# Patient Record
Sex: Female | Born: 1975 | Race: Black or African American | Hispanic: No | Marital: Single | Smoking: Never smoker
Health system: Southern US, Community
[De-identification: ages and names within clinical notes are randomized; demographics above are authoritative.]

## PROBLEM LIST (undated history)

## (undated) DIAGNOSIS — Z8619 Personal history of other infectious and parasitic diseases: Secondary | ICD-10-CM

## (undated) DIAGNOSIS — R51 Headache: Secondary | ICD-10-CM

## (undated) DIAGNOSIS — D649 Anemia, unspecified: Secondary | ICD-10-CM

## (undated) DIAGNOSIS — R011 Cardiac murmur, unspecified: Secondary | ICD-10-CM

## (undated) DIAGNOSIS — J45909 Unspecified asthma, uncomplicated: Secondary | ICD-10-CM

## (undated) DIAGNOSIS — I10 Essential (primary) hypertension: Secondary | ICD-10-CM

## (undated) DIAGNOSIS — D259 Leiomyoma of uterus, unspecified: Secondary | ICD-10-CM

## (undated) DIAGNOSIS — N920 Excessive and frequent menstruation with regular cycle: Secondary | ICD-10-CM

## (undated) HISTORY — DX: Essential (primary) hypertension: I10

## (undated) HISTORY — PX: WISDOM TOOTH EXTRACTION: SHX21

## (undated) HISTORY — DX: Anemia, unspecified: D64.9

## (undated) HISTORY — DX: Leiomyoma of uterus, unspecified: D25.9

## (undated) HISTORY — DX: Personal history of other infectious and parasitic diseases: Z86.19

## (undated) HISTORY — DX: Unspecified asthma, uncomplicated: J45.909

## (undated) HISTORY — DX: Excessive and frequent menstruation with regular cycle: N92.0

## (undated) HISTORY — DX: Headache: R51

## (undated) HISTORY — DX: Cardiac murmur, unspecified: R01.1

## (undated) HISTORY — PX: TOOTH EXTRACTION: SHX859

---
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
## 1995-06-22 DIAGNOSIS — N6019 Diffuse cystic mastopathy of unspecified breast: Secondary | ICD-10-CM | POA: Insufficient documentation

## 1995-06-22 HISTORY — PX: MYOMECTOMY: SHX85

## 1998-01-13 ENCOUNTER — Emergency Department (HOSPITAL_COMMUNITY): Admission: EM | Admit: 1998-01-13 | Discharge: 1998-01-13 | Payer: Self-pay | Admitting: Emergency Medicine

## 1999-02-02 ENCOUNTER — Other Ambulatory Visit: Admission: RE | Admit: 1999-02-02 | Discharge: 1999-02-02 | Payer: Self-pay | Admitting: Family Medicine

## 2003-03-21 ENCOUNTER — Emergency Department (HOSPITAL_COMMUNITY): Admission: AD | Admit: 2003-03-21 | Discharge: 2003-03-21 | Payer: Self-pay | Admitting: *Deleted

## 2004-05-07 ENCOUNTER — Emergency Department: Payer: Self-pay | Admitting: General Practice

## 2006-09-06 ENCOUNTER — Ambulatory Visit: Payer: Self-pay | Admitting: Internal Medicine

## 2006-09-07 DIAGNOSIS — I10 Essential (primary) hypertension: Secondary | ICD-10-CM | POA: Insufficient documentation

## 2006-09-07 DIAGNOSIS — Z8679 Personal history of other diseases of the circulatory system: Secondary | ICD-10-CM | POA: Insufficient documentation

## 2006-09-22 ENCOUNTER — Ambulatory Visit: Payer: Self-pay | Admitting: *Deleted

## 2006-10-04 ENCOUNTER — Encounter: Payer: Self-pay | Admitting: Internal Medicine

## 2006-10-04 ENCOUNTER — Ambulatory Visit: Payer: Self-pay | Admitting: Internal Medicine

## 2006-10-26 ENCOUNTER — Ambulatory Visit: Payer: Self-pay | Admitting: Internal Medicine

## 2006-11-08 ENCOUNTER — Encounter: Payer: Self-pay | Admitting: Internal Medicine

## 2006-11-08 ENCOUNTER — Ambulatory Visit (HOSPITAL_COMMUNITY): Admission: RE | Admit: 2006-11-08 | Discharge: 2006-11-08 | Payer: Self-pay | Admitting: Internal Medicine

## 2006-11-19 ENCOUNTER — Emergency Department: Payer: Self-pay | Admitting: Emergency Medicine

## 2006-12-12 ENCOUNTER — Ambulatory Visit: Payer: Self-pay | Admitting: Internal Medicine

## 2007-02-13 ENCOUNTER — Telehealth (INDEPENDENT_AMBULATORY_CARE_PROVIDER_SITE_OTHER): Payer: Self-pay | Admitting: Nurse Practitioner

## 2007-02-15 ENCOUNTER — Ambulatory Visit: Payer: Self-pay | Admitting: Family Medicine

## 2007-02-17 ENCOUNTER — Ambulatory Visit (HOSPITAL_COMMUNITY): Admission: RE | Admit: 2007-02-17 | Discharge: 2007-02-17 | Payer: Self-pay | Admitting: Internal Medicine

## 2007-03-02 ENCOUNTER — Ambulatory Visit: Payer: Self-pay | Admitting: Internal Medicine

## 2007-03-02 DIAGNOSIS — D509 Iron deficiency anemia, unspecified: Secondary | ICD-10-CM

## 2007-03-14 ENCOUNTER — Telehealth (INDEPENDENT_AMBULATORY_CARE_PROVIDER_SITE_OTHER): Payer: Self-pay | Admitting: Nurse Practitioner

## 2007-06-01 ENCOUNTER — Ambulatory Visit: Payer: Self-pay | Admitting: Obstetrics and Gynecology

## 2007-07-20 ENCOUNTER — Ambulatory Visit: Payer: Self-pay | Admitting: Obstetrics and Gynecology

## 2007-08-04 ENCOUNTER — Ambulatory Visit: Payer: Self-pay | Admitting: Nurse Practitioner

## 2007-08-04 DIAGNOSIS — K029 Dental caries, unspecified: Secondary | ICD-10-CM | POA: Insufficient documentation

## 2007-08-04 DIAGNOSIS — E669 Obesity, unspecified: Secondary | ICD-10-CM | POA: Insufficient documentation

## 2007-08-04 LAB — CONVERTED CEMR LAB
Eosinophils Relative: 2 % (ref 0–5)
HCT: 37 % (ref 36.0–46.0)
Hemoglobin: 11.7 g/dL — ABNORMAL LOW (ref 12.0–15.0)
Hgb A1c MFr Bld: 5.8 %
Lymphocytes Relative: 35 % (ref 12–46)
Lymphs Abs: 2.1 10*3/uL (ref 0.7–4.0)
Monocytes Absolute: 0.4 10*3/uL (ref 0.1–1.0)
RBC: 4.46 M/uL (ref 3.87–5.11)
WBC: 6 10*3/uL (ref 4.0–10.5)

## 2007-08-07 ENCOUNTER — Encounter (INDEPENDENT_AMBULATORY_CARE_PROVIDER_SITE_OTHER): Payer: Self-pay | Admitting: Nurse Practitioner

## 2007-09-14 ENCOUNTER — Ambulatory Visit: Payer: Self-pay | Admitting: Nurse Practitioner

## 2007-09-14 LAB — CONVERTED CEMR LAB
CO2: 29 meq/L (ref 19–32)
Calcium: 8.8 mg/dL (ref 8.4–10.5)
Chloride: 104 meq/L (ref 96–112)
Cholesterol: 117 mg/dL (ref 0–200)
Creatinine, Ser: 0.81 mg/dL (ref 0.40–1.20)
Glucose, Bld: 64 mg/dL — ABNORMAL LOW (ref 70–99)
Total Bilirubin: 0.3 mg/dL (ref 0.3–1.2)
Total CHOL/HDL Ratio: 2.3
Total Protein: 6.6 g/dL (ref 6.0–8.3)
Triglycerides: 51 mg/dL (ref <150)
VLDL: 10 mg/dL (ref 0–40)

## 2007-09-21 ENCOUNTER — Ambulatory Visit: Payer: Self-pay | Admitting: Obstetrics and Gynecology

## 2007-10-16 ENCOUNTER — Ambulatory Visit: Payer: Self-pay | Admitting: Nurse Practitioner

## 2007-10-16 DIAGNOSIS — M79609 Pain in unspecified limb: Secondary | ICD-10-CM

## 2007-10-16 LAB — CONVERTED CEMR LAB
ALT: 18 units/L (ref 0–35)
AST: 17 units/L (ref 0–37)
Basophils Absolute: 0 10*3/uL (ref 0.0–0.1)
Basophils Relative: 0 % (ref 0–1)
Bilirubin Urine: NEGATIVE
Blood in Urine, dipstick: NEGATIVE
CO2: 25 meq/L (ref 19–32)
Calcium: 9.1 mg/dL (ref 8.4–10.5)
Chlamydia, DNA Probe: NEGATIVE
Chloride: 102 meq/L (ref 96–112)
Cholesterol: 127 mg/dL (ref 0–200)
GC Probe Amp, Genital: NEGATIVE
Hemoglobin: 11.6 g/dL — ABNORMAL LOW (ref 12.0–15.0)
KOH Prep: NEGATIVE
Ketones, urine, test strip: NEGATIVE
Lymphocytes Relative: 31 % (ref 12–46)
MCHC: 30.8 g/dL (ref 30.0–36.0)
Neutro Abs: 3.7 10*3/uL (ref 1.7–7.7)
Neutrophils Relative %: 59 % (ref 43–77)
Nitrite: NEGATIVE
Platelets: 246 10*3/uL (ref 150–400)
Potassium: 4.2 meq/L (ref 3.5–5.3)
RDW: 15.3 % (ref 11.5–15.5)
Sodium: 139 meq/L (ref 135–145)
Specific Gravity, Urine: 1.015
TSH: 1.424 microintl units/mL (ref 0.350–5.50)
Total CHOL/HDL Ratio: 2.4
Total Protein: 7 g/dL (ref 6.0–8.3)

## 2007-10-17 LAB — CONVERTED CEMR LAB: Pap Smear: NEGATIVE

## 2007-10-23 ENCOUNTER — Encounter (INDEPENDENT_AMBULATORY_CARE_PROVIDER_SITE_OTHER): Payer: Self-pay | Admitting: Nurse Practitioner

## 2008-02-28 ENCOUNTER — Other Ambulatory Visit: Payer: Self-pay | Admitting: Obstetrics and Gynecology

## 2008-02-28 ENCOUNTER — Telehealth (INDEPENDENT_AMBULATORY_CARE_PROVIDER_SITE_OTHER): Payer: Self-pay | Admitting: Nurse Practitioner

## 2008-02-28 ENCOUNTER — Emergency Department (HOSPITAL_COMMUNITY): Admission: EM | Admit: 2008-02-28 | Discharge: 2008-02-28 | Payer: Self-pay | Admitting: Emergency Medicine

## 2008-03-04 ENCOUNTER — Ambulatory Visit: Payer: Self-pay | Admitting: Nurse Practitioner

## 2008-03-04 DIAGNOSIS — R498 Other voice and resonance disorders: Secondary | ICD-10-CM | POA: Insufficient documentation

## 2008-03-22 ENCOUNTER — Ambulatory Visit: Payer: Self-pay | Admitting: Nurse Practitioner

## 2008-04-05 ENCOUNTER — Ambulatory Visit: Payer: Self-pay | Admitting: Obstetrics & Gynecology

## 2008-04-05 ENCOUNTER — Other Ambulatory Visit: Admission: RE | Admit: 2008-04-05 | Discharge: 2008-04-05 | Payer: Self-pay | Admitting: Obstetrics and Gynecology

## 2008-04-05 ENCOUNTER — Encounter (INDEPENDENT_AMBULATORY_CARE_PROVIDER_SITE_OTHER): Payer: Self-pay | Admitting: Nurse Practitioner

## 2008-04-05 DIAGNOSIS — Z9889 Other specified postprocedural states: Secondary | ICD-10-CM | POA: Insufficient documentation

## 2008-04-10 ENCOUNTER — Encounter (INDEPENDENT_AMBULATORY_CARE_PROVIDER_SITE_OTHER): Payer: Self-pay | Admitting: Nurse Practitioner

## 2008-04-19 ENCOUNTER — Ambulatory Visit: Payer: Self-pay | Admitting: Obstetrics & Gynecology

## 2008-05-13 ENCOUNTER — Ambulatory Visit (HOSPITAL_COMMUNITY): Admission: RE | Admit: 2008-05-13 | Discharge: 2008-05-13 | Payer: Self-pay | Admitting: Obstetrics & Gynecology

## 2008-07-22 IMAGING — US US TRANSVAGINAL NON-OB
1 series · 14 of 25 positions shown · non-contrast
Comparison: None

CLINICAL DATA: Pelvic pain

TRANSABDOMINAL AND TRANSVAGINAL PELVIC ULTRASOUND
TECHNIQUE: Both transabdominal and transvaginal ultrasound examinations of the
pelvis were performed including evaluation of the uterus, ovaries, adnexal
regions, and pelvic cul-de-sac.

[Series 1: unknown · 0.37mm/px · 14 of 48 slices shown]
[im 1/48]
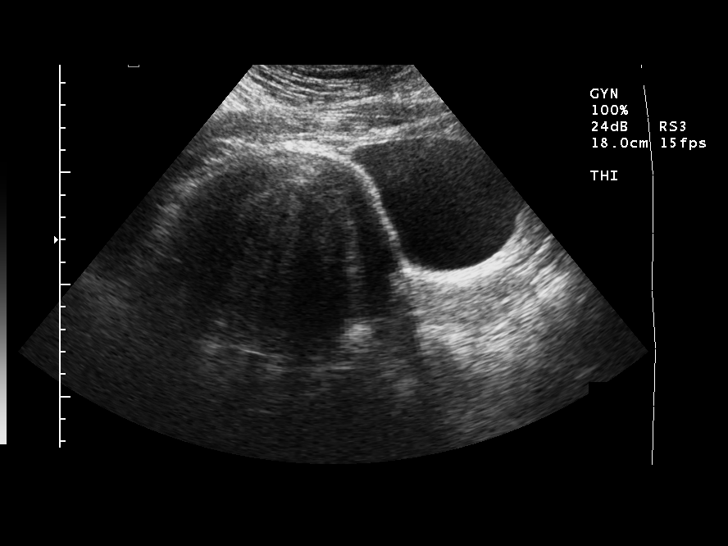
[im 4/48]
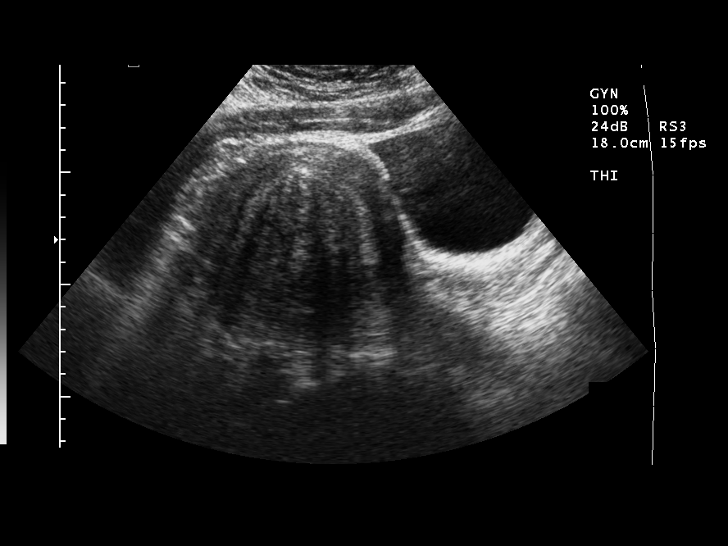
[im 8/48]
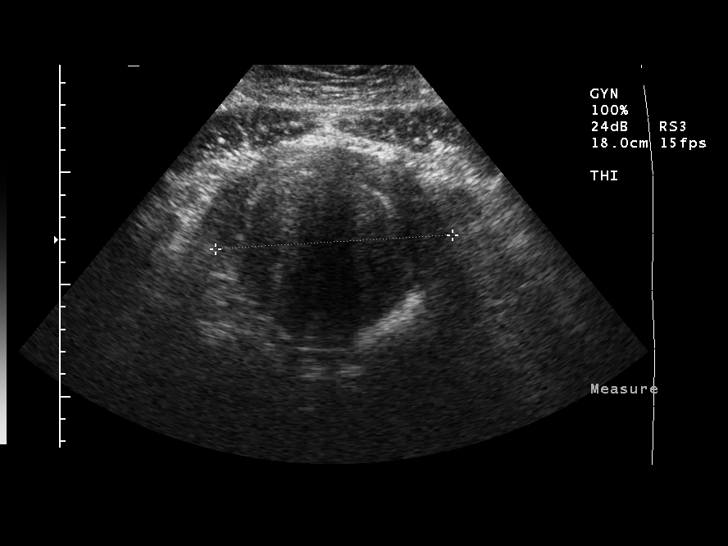
[im 12/48]
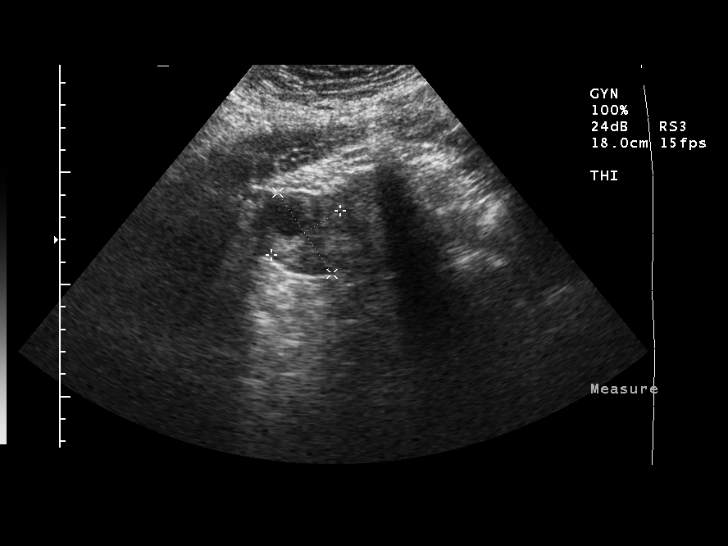
[im 16/48]
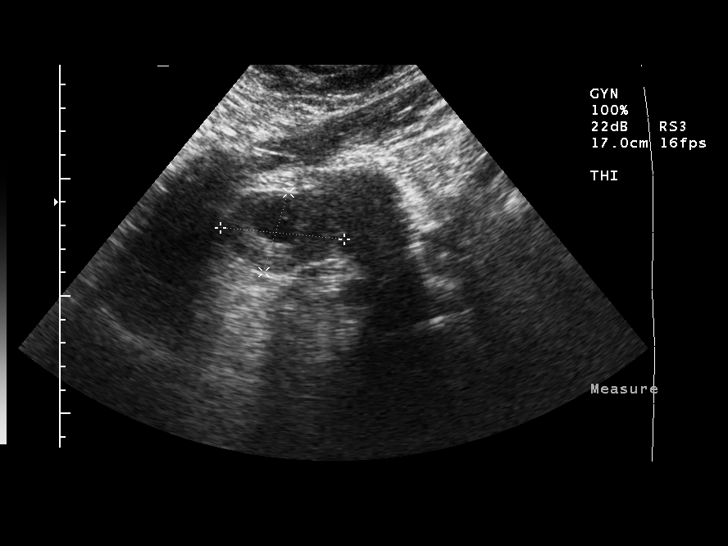
[im 18/48]
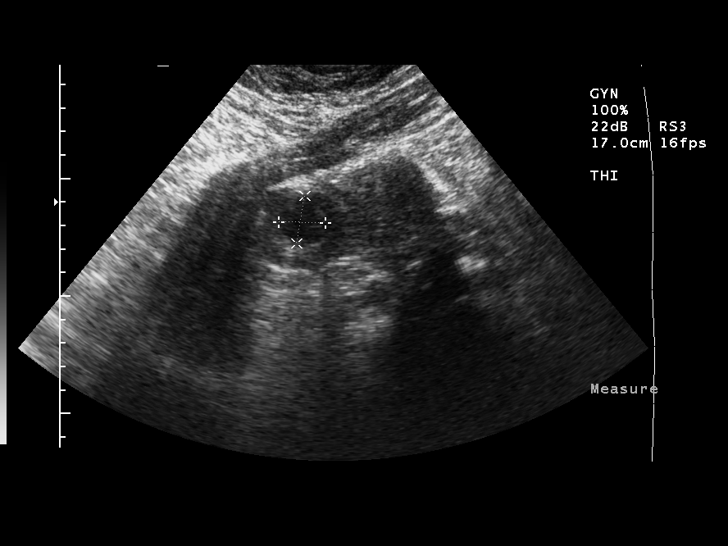
[im 22/48]
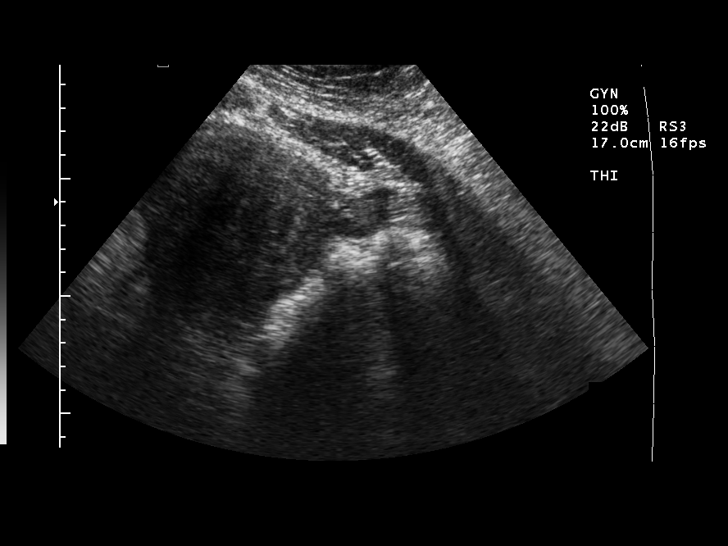
[im 26/48]
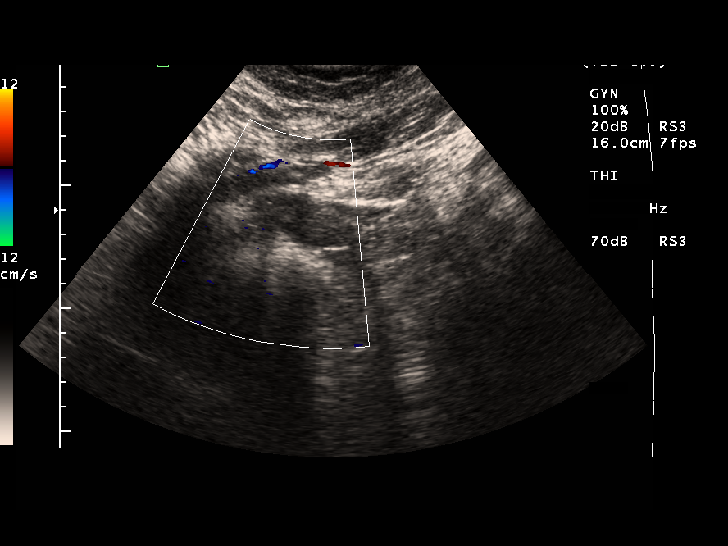
[im 30/48]
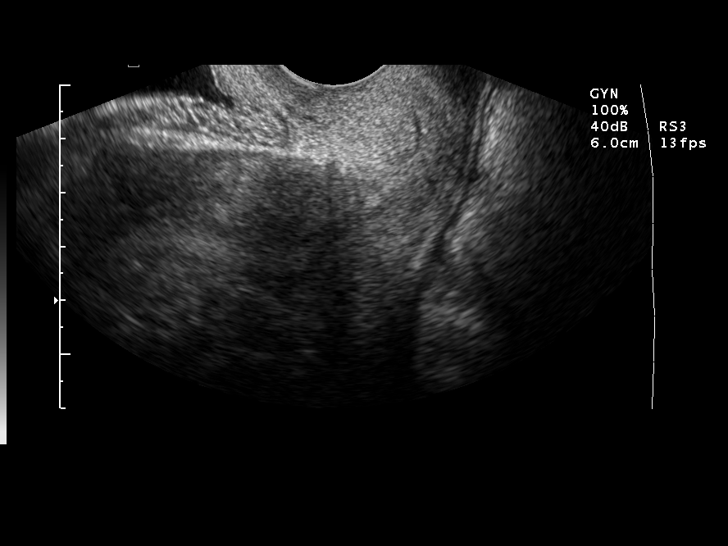
[im 32/48]
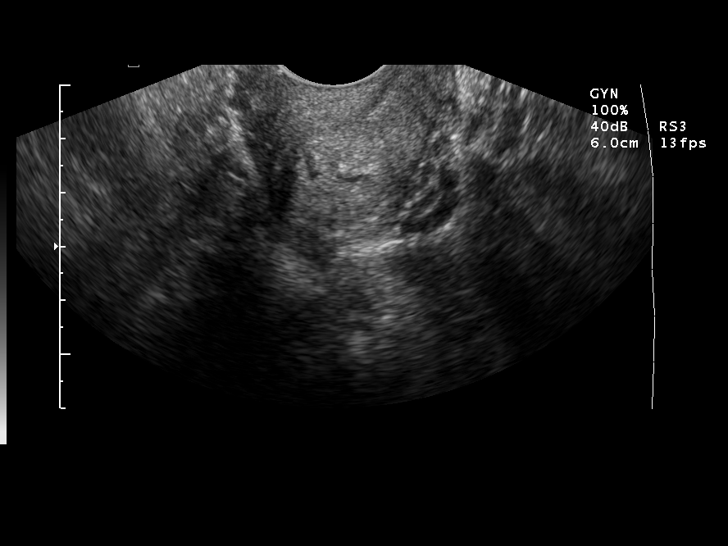
[im 36/48]
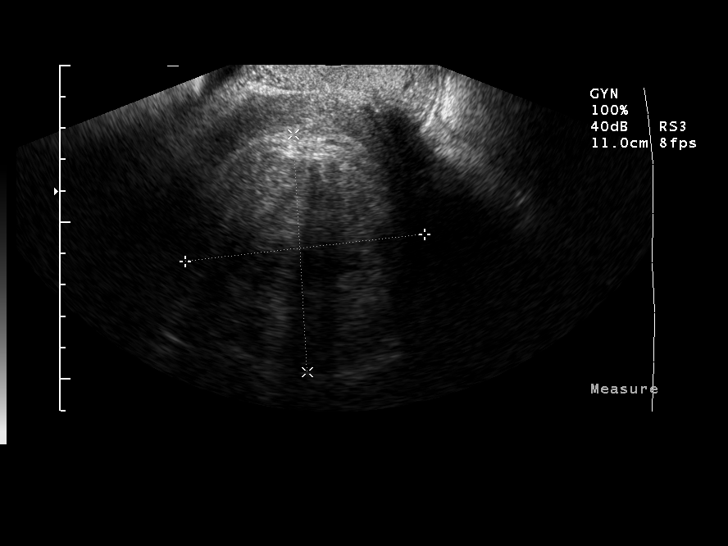
[im 40/48]
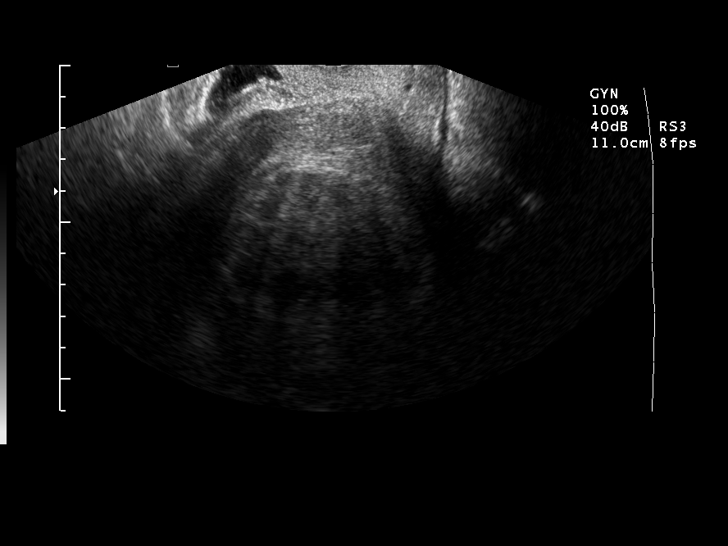
[im 44/48]
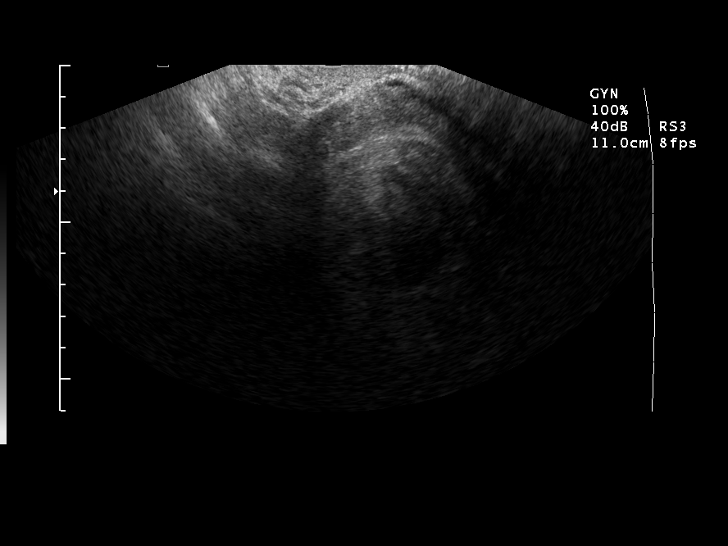
[im 48/48]
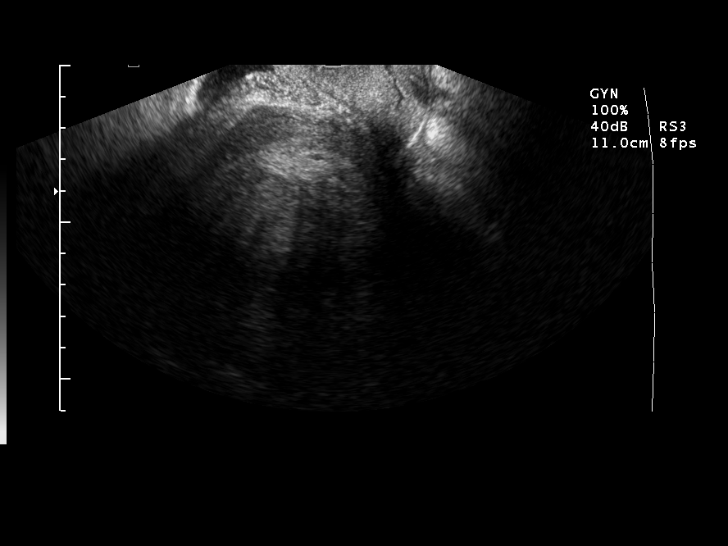

[14 of 25 positions shown; findings below may reference images not displayed]

FINDINGS: Uterus measures 14.4 x 8.7 x 10.6 cm. Large central fibroid noted
measuring up to 7.3 cm. Endometrium is obscured.

2.1 cm minimally complex cysts noted in the right ovary. Left ovary
unremarkable. No free fluid.

IMPRESSION

Uterus is enlarged with a large 7.3 cm central fibroid, which obscures the
endometrium. 

Minimally complex 2.1 cm right ovarian cyst. This could be followed with repeat
ultrasound in 1-2 cycles.

## 2008-07-30 ENCOUNTER — Ambulatory Visit: Payer: Self-pay | Admitting: Obstetrics & Gynecology

## 2008-08-07 ENCOUNTER — Telehealth (INDEPENDENT_AMBULATORY_CARE_PROVIDER_SITE_OTHER): Payer: Self-pay | Admitting: Nurse Practitioner

## 2008-08-20 ENCOUNTER — Encounter (INDEPENDENT_AMBULATORY_CARE_PROVIDER_SITE_OTHER): Payer: Self-pay | Admitting: *Deleted

## 2008-09-06 ENCOUNTER — Telehealth (INDEPENDENT_AMBULATORY_CARE_PROVIDER_SITE_OTHER): Payer: Self-pay | Admitting: Nurse Practitioner

## 2010-01-15 ENCOUNTER — Ambulatory Visit: Payer: Self-pay | Admitting: Obstetrics and Gynecology

## 2010-01-23 ENCOUNTER — Ambulatory Visit (HOSPITAL_COMMUNITY): Admission: RE | Admit: 2010-01-23 | Discharge: 2010-01-23 | Payer: Self-pay | Admitting: Obstetrics & Gynecology

## 2010-01-28 ENCOUNTER — Ambulatory Visit: Payer: Self-pay | Admitting: Obstetrics & Gynecology

## 2010-04-26 ENCOUNTER — Emergency Department (HOSPITAL_COMMUNITY): Admission: EM | Admit: 2010-04-26 | Discharge: 2010-04-27 | Payer: Self-pay | Admitting: Emergency Medicine

## 2010-05-12 ENCOUNTER — Emergency Department (HOSPITAL_COMMUNITY): Admission: EM | Admit: 2010-05-12 | Discharge: 2010-05-12 | Payer: Self-pay | Admitting: Emergency Medicine

## 2010-05-21 ENCOUNTER — Ambulatory Visit: Payer: Self-pay | Admitting: Obstetrics and Gynecology

## 2010-05-21 ENCOUNTER — Encounter: Payer: Self-pay | Admitting: Obstetrics and Gynecology

## 2010-05-21 LAB — CONVERTED CEMR LAB
Hemoglobin: 9.2 g/dL — ABNORMAL LOW (ref 12.0–15.0)
MCHC: 29.4 g/dL — ABNORMAL LOW (ref 30.0–36.0)
RBC: 4.42 M/uL (ref 3.87–5.11)
RDW: 19.9 % — ABNORMAL HIGH (ref 11.5–15.5)

## 2010-06-04 ENCOUNTER — Ambulatory Visit: Payer: Self-pay | Admitting: Obstetrics & Gynecology

## 2010-07-12 ENCOUNTER — Encounter: Payer: Self-pay | Admitting: *Deleted

## 2010-09-01 LAB — URINE MICROSCOPIC-ADD ON

## 2010-09-01 LAB — POCT I-STAT, CHEM 8
BUN: 8 mg/dL (ref 6–23)
Calcium, Ion: 1.16 mmol/L (ref 1.12–1.32)
Chloride: 105 mEq/L (ref 96–112)
Glucose, Bld: 95 mg/dL (ref 70–99)
TCO2: 26 mmol/L (ref 0–100)

## 2010-09-01 LAB — URINALYSIS, ROUTINE W REFLEX MICROSCOPIC
Glucose, UA: NEGATIVE mg/dL
Ketones, ur: NEGATIVE mg/dL
Leukocytes, UA: NEGATIVE
Nitrite: NEGATIVE
Protein, ur: NEGATIVE mg/dL
pH: 7 (ref 5.0–8.0)

## 2010-11-03 NOTE — Group Therapy Note (Signed)
Cassie Mills, Cassie Mills NO.:  [id number]   MEDICAL RECORD NO.:  [id number]          PATIENT TYPE:  WOC   LOCATION:  [facility]                   FACILITY:  [facility]   PHYSICIAN:  Argentina Donovan, MD        DATE OF BIRTH:  [birth year]   DATE OF SERVICE:  09/21/2007                                  CLINIC NOTE   The patient is a 35 year old African-American female who has been  bothered by some dysfunction bleeding.  She was placed on iron therapy  for anemia.  An ultrasound showed the uterus about 14 x 8 x 10 cm with a  7.5 cm central fibroid skirting the endometrium.  She plans on being  married next year.  She had a previous myomectomy done in 1997 by Dr.  Ambrose Mantle, who had a very difficult time with the procedure because of  visualization problems.  The patient's other complaint is that she tends  to have chronic pelvic pain secondary to fibroids, especially during her  period and when she has bleeding.  She was wondering about pregnancy.  I  told her that I am afraid with that fibroid in there that she would  probably end up with preterm baby.  She wanted me to reevaluate that, so  I told her we would do a hysteroscopy and D&C and see what that could  show from that point of view, but I think that she would probably be  better off with a hysterectomy.  She says that she would not object to  hysterectomy if it did not look as if it would be practical to do  anything through the vagina with the hysteroscope in order to control or  change the problem.   IMPRESSION:  Uterine fibroids with menometrorrhagia.   PLAN:  A diagnostic hysteroscopy and D&C, and to be able to better  counsel the patient.  I told her that if she decides on a hysterectomy,  we have to get her to financial counseling to begin with, and she  understands that.           ______________________________  Argentina Donovan, MD     PR/MEDQ  D:  09/21/2007  T:  09/21/2007  Job:  [id number]

## 2010-11-03 NOTE — Group Therapy Note (Signed)
NAMESELMA, MINK NO.:  [id number]   MEDICAL RECORD NO.:  [id number]          PATIENT TYPE:  WOC   LOCATION:  [facility]                   FACILITY:  [facility]   PHYSICIAN:  Allie Bossier, MD        DATE OF BIRTH:  [birth year]   DATE OF SERVICE:  04/05/2008                                  CLINIC NOTE   REASON FOR VISIT:  Endometrial biopsy.   HISTORY OF PRESENT ILLNESS:  Ms. Khachatryan is a 35 year old G0 female with  approximately 1-year history of heavy periods and painful periods.  She  did have an ultrasound back in August 2008, which showed a uterus that  measured 14.4 x 8.7 x 10.6 cm with a large central fibroid noted  measuring up to 7.3 cm.  Since that time, she has continued to have  heavy bleeding; however, that is not her most bothersome symptom and is  the pain she feels with her period.  She does take Aleve, which relieves  her pain quite a bit but not entirely.  She has had a history of a  myomectomy in 1997 by Dr. Ambrose Mantle and she is not currently sexually  active.  Her first date of the last menstrual period was 03/24/2008.  Last Pap smear was in May 2009 and was normal.  She has not had any  mammograms as of yet.   PAST MEDICAL HISTORY:  1. Hypertension.  2. Anemia.   MEDICATIONS:  1. Iron 325 mg daily.  2. Avalide 5 mg daily.   ALLERGIES:  She has not allergic to any medicines.   PAST SURGICAL HISTORY:  As stated above, she had a myomectomy in 1997 by  Dr. Ambrose Mantle.   OB HISTORY:  She is G0.   GYN HISTORY:  No history of abnormal Pap smear.  Urine pregnancy test  today is negative.   PHYSICAL EXAMINATION:  VITAL SIGNS:  Temperature today is 98.9, pulse  70, blood pressure 132/82.  Weight is 256.6 pounds and height is 5 feet  4 inches tall.  GENERAL:  She is alert, well-developed, well-nourished, obese female, in  no acute distress, and cooperative to exam.  GU:  Bimanual was performed showing an approximately 16-18 week size  uterus.  She  has normal external Tanner V genitalia without vulvar  lesion.  Vagina was pink and moist with normal rugae.  Cervix was well  visualized in the midline.  No obvious lesion or friability.  An  endometrial biopsy was obtained.  The cervix was coated with iodine  prior to the procedure.  The uterus was sounded to approximately 16 cm,  the Pipelle was inserted and one full rotation was obtained with a  return of the large tissue sample, which was placed in formalin and sent  for pathology.  The patient tolerated the procedure well.   ASSESSMENT AND PLAN:  This is a 35 year old G0 female with dysmenorrhea  and menorrhagia as well as a history of fibroids who presents today for  endometrial biopsy.  1. Biopsy was obtained as described above.  The patient will return in  2 weeks for results.  She will also be scheduled for a transvaginal      ultrasound to further characterize the anatomy of her uterus and      endometrium.     ______________________________  Ardeen Garland, MD    ______________________________  Allie Bossier, MD    LM/MEDQ  D:  04/05/2008  T:  04/06/2008  Job:  [id number]

## 2010-11-03 NOTE — Group Therapy Note (Signed)
Cassie Mills, SCHAFFERT NO.:  [id number]   MEDICAL RECORD NO.:  [id number]          PATIENT TYPE:  WOC   LOCATION:  [facility]                   FACILITY:  [facility]   PHYSICIAN:  Argentina Donovan, MD        DATE OF BIRTH:  08-03-1975   DATE OF SERVICE:  06/01/2007                                  CLINIC NOTE   The patient is a 35 year old African American female who was sent in  from [facility] because of dysfunctional uterine bleeding.  She had  been placed on iron therapy because of anemia secondary to the problem  and they sent her for an ultrasound and ultrasound showed a uterus that  was enlarged 14 x 8 x 10 cm with a large 7.5 cm central fibroid that  obscured the endometrium.  She plans on getting married next June and  was interested in:  1. Controlling her bleeding.  2. Talking about pregnancy.   Apparently, she had a myomectomy done by Dr. Ambrose Mantle and 1997 and the  records are not available at the present. They are on micro film so we  are going to request those and her come back in a couple weeks to be  able to more intelligently discuss whether myomectomy would be practical  or whether hysterectomy should be what should be considered.   The patient weighs 256 pounds. She is 5 feet 4 inches tall and today she  has a slight blood pressure elevation of 140/98.   She is on lisinopril, iron and prenatal vitamins daily.   She has never had a pregnancy and never used any contraception.   IMPRESSION:  Is symptomatic fibroids with dysfunctional bleeding and  secondary anemia. The patient will be given an appointment to come back  in 2 weeks to discuss the condition further.           ______________________________  Argentina Donovan, MD     PR/MEDQ  D:  06/01/2007  T:  06/01/2007  Job:  [id number]

## 2010-11-03 NOTE — Group Therapy Note (Signed)
Cassie Mills, Cassie Mills NO.:  [id number]   MEDICAL RECORD NO.:  [id number]          PATIENT TYPE:  WOC   LOCATION:  [facility]                   FACILITY:  [facility]   PHYSICIAN:  Johnella Moloney, MD        DATE OF BIRTH:  1975-08-17   DATE OF SERVICE:                                  CLINIC NOTE   The patient is a 35 year old, gravida 0, who was seen on April 05, 2008, for 1-year history of menorrhagia and dysmenorrhea and known  pelvic fibroids.  Upon her evaluation, the patient did undergo an  endometrial biopsy, and she is here to follow up results.  The patient  reports continued pain in her pelvis and lower back, but no other  symptoms.   Her endometrial biopsy was remarkable for proliferative endometrium with  stromal hemorrhage.  No endometrial hyperplasia or malignancy  identified.  The patient was reassured by this result.  Of note, the  patient did have an ultrasound in August 2008 which showed a fibroid  uterus measuring 15 cm in the largest dimension and a large central  fibroid measuring 7.3 cm.  She does have a history of a myomectomy in  1997 by Dr. Ambrose Mantle.  The plan was for her to get an ultrasound after her  prior visit, but this was not scheduled, so we will go ahead and  schedule a pelvic ultrasound to see what the change has been in 1 year.  The patient was advised about different options for management including  Depo-Lupron, which could be used to shrink the fibroids.  Myomectomy  and/or hysterectomy, the patient is unsure of what she wants to do for  how.  She is not interested in myomectomy and wants definitive surgery  even though she is a gravida 0.  She says she does not have any future  plans for child bearing.  The patient was given a patient information  handout on how to manage fibroids.  She was also given the application  for Depo-Lupron to fill out given that the patient is self-pay.  The  patient is in the process of applying for the  [facility] sliding fee and  discount and hopes that she will be able to get this in time for her  surgery.  In the meantime, if she does qualify for the Lupron, the  patient is interested in getting this as a way to shrink her fibroid  uterus and also make the operative modality and her symptoms better and  also minimize blood loss.  The patient reports taking Aleve for her  dysmenorrhea which usually helps with her pain.  The patient will follow  up in the [facility] after a pelvic ultrasound for discussion of  pelvic ultrasound results and also to make definitive plans for surgery.            ______________________________  Johnella Moloney, MD     UD/MEDQ  D:  04/19/2008  T:  04/19/2008  Job:  [id number]

## 2011-03-29 LAB — POCT PREGNANCY, URINE: Operator id: 297281

## 2011-09-30 IMAGING — CR DG CHEST 2V
2 series · 2 of 2 positions shown · non-contrast
Comparison: None.

CLINICAL DATA: Chest pain.

CHEST - 2 VIEW

[w chest pa *]
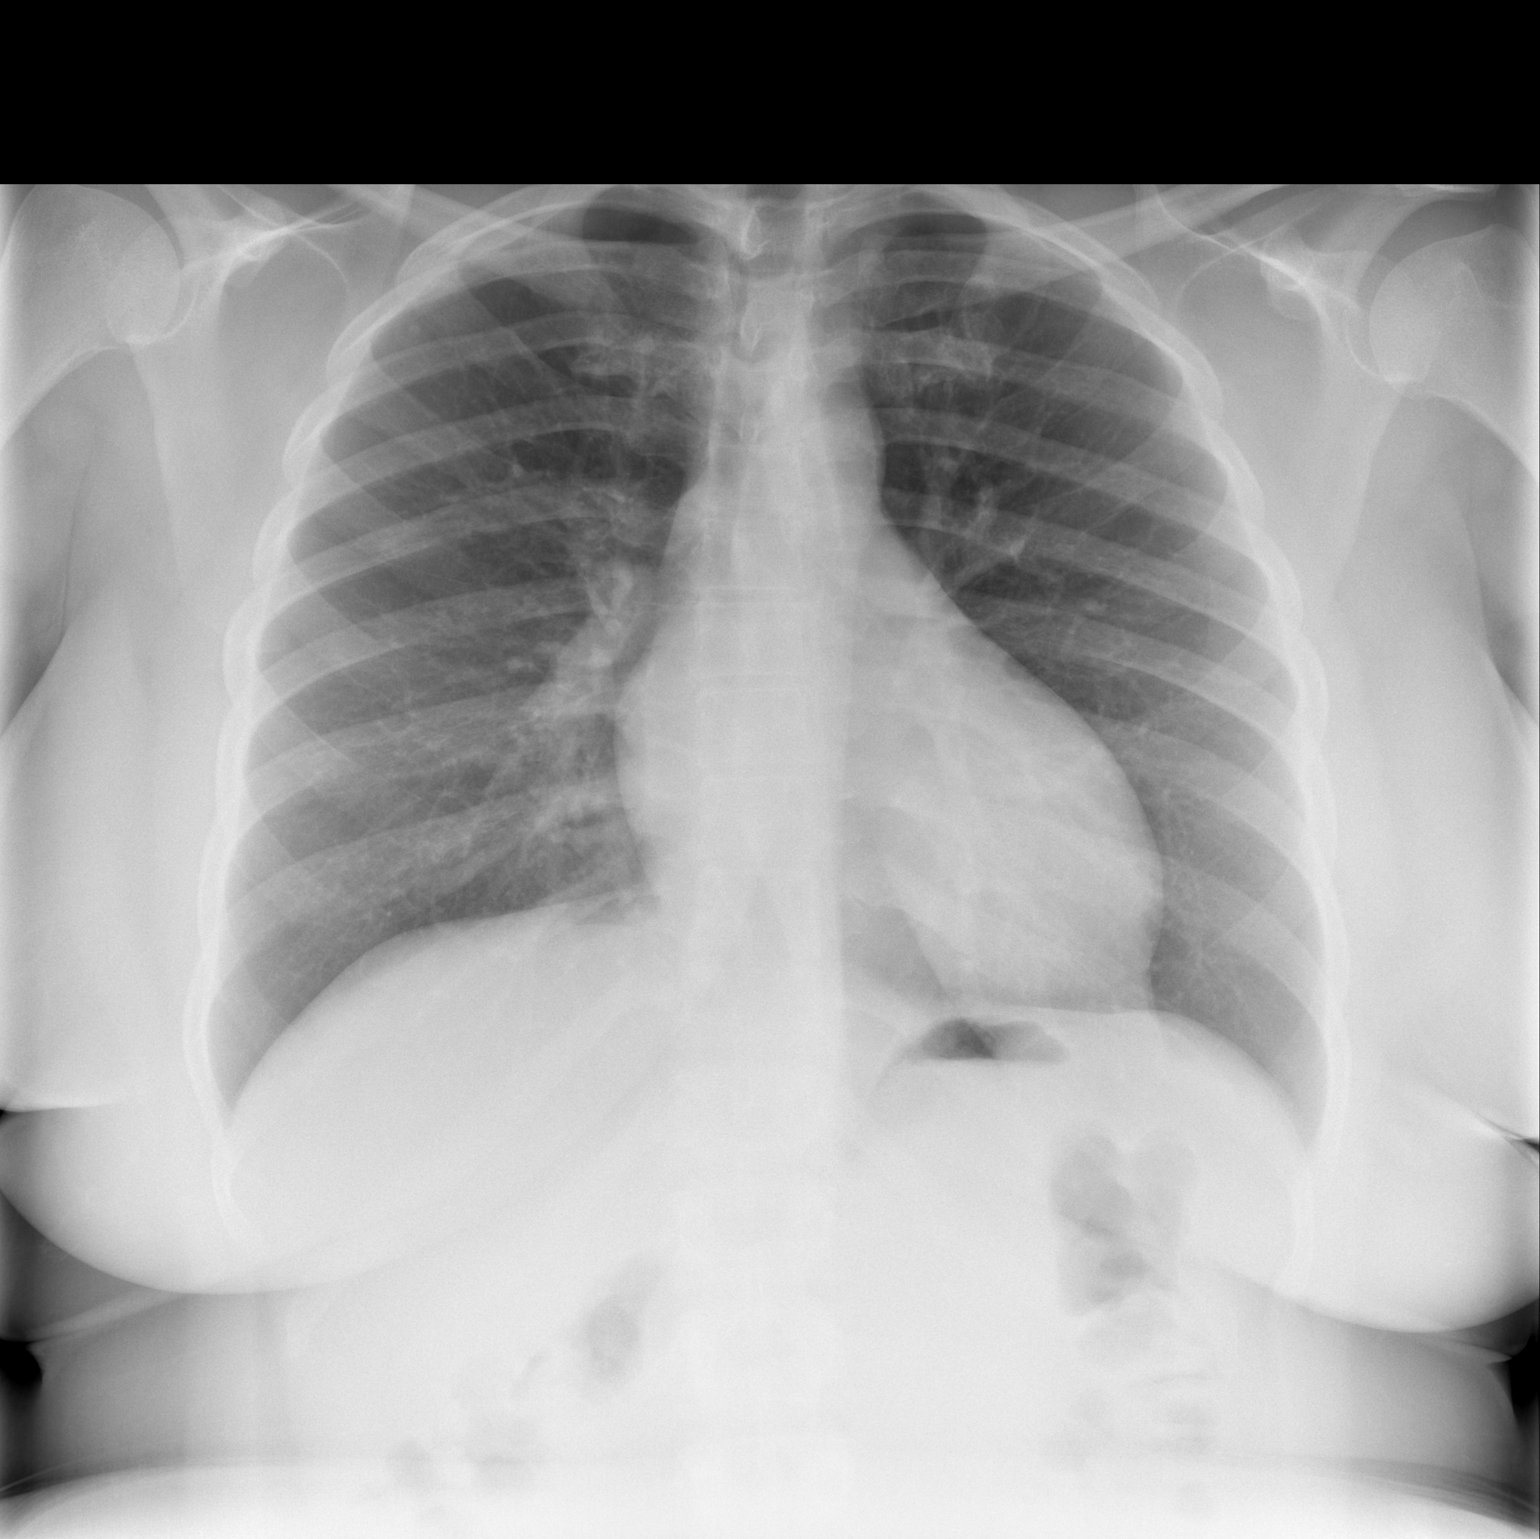

[w chest lat *]
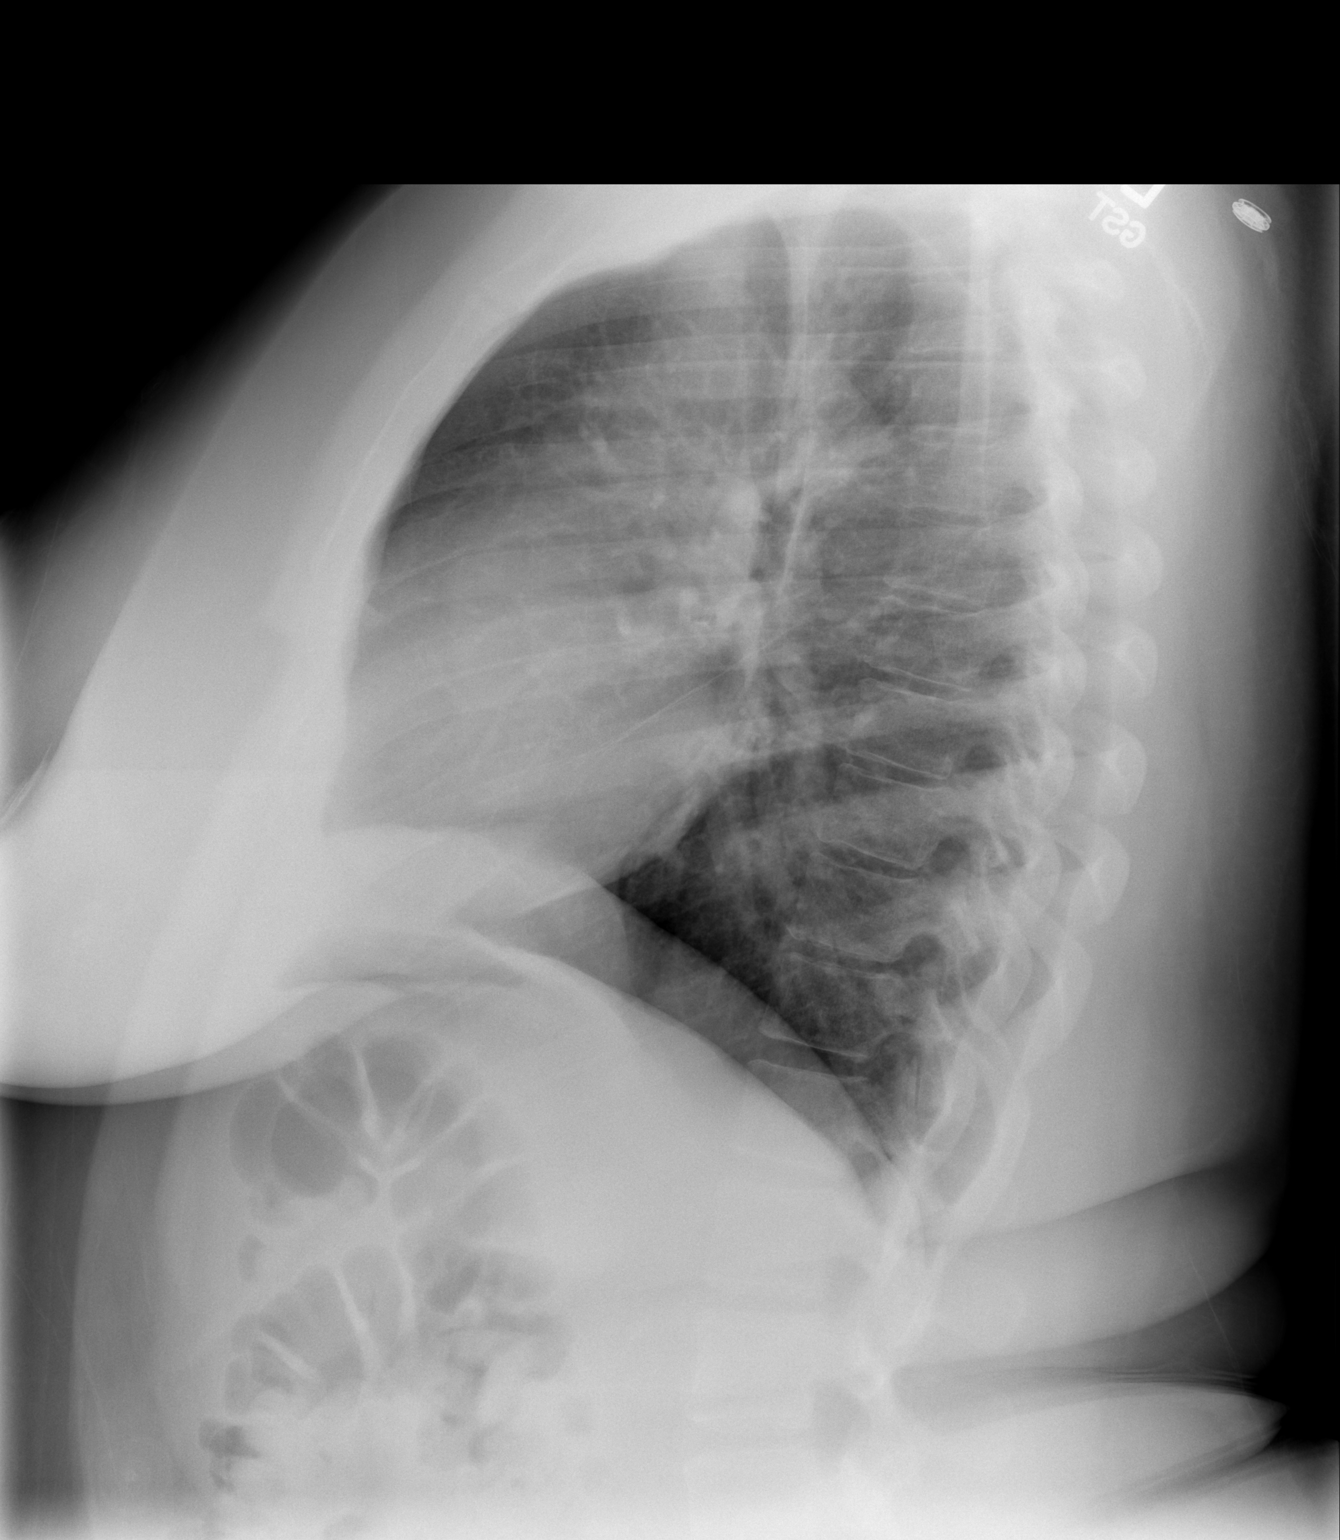

[2 of 2 positions shown; findings below may reference images not displayed]

FINDINGS: Lungs clear.  Cardiopericardial silhouette within normal
limits.  Trachea midline.  No airspace disease or effusion.
IMPRESSION: No active cardiopulmonary disease.

## 2011-10-15 IMAGING — CT CT HEAD W/O CM
1 series · 16 of 30 positions shown, 20 images · non-contrast
Comparison: None

CLINICAL DATA: Headache

CT HEAD WITHOUT CONTRAST
TECHNIQUE: Contiguous axial images were obtained from the base of
the skull through the vertex without contrast.

[Series 2: head routine 4.8 h37s · axial · 0.43mm/px · z∈[-115,+16]mm · 16 of 30 slices shown, 20 images]
[im 2/30  brain]
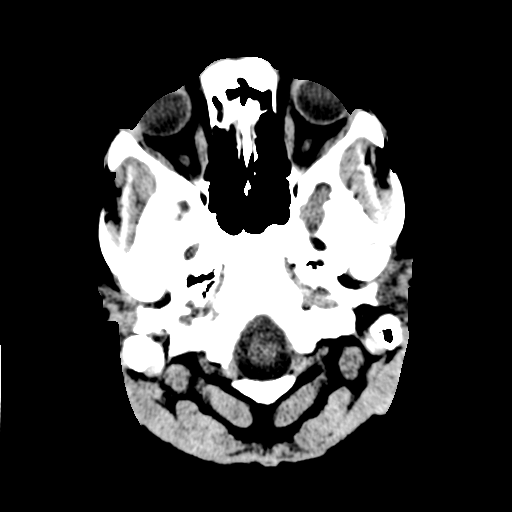
[im 2/30  bone]
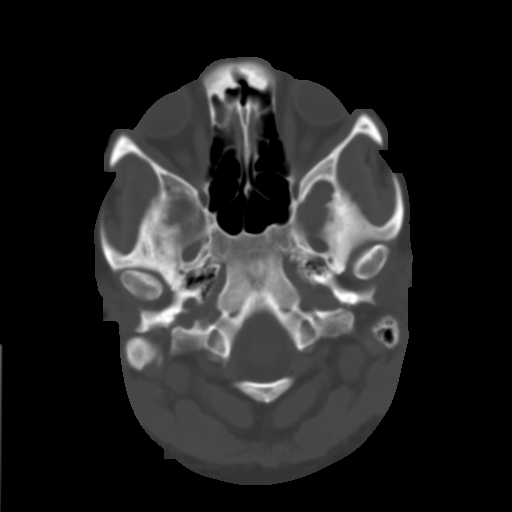
[im 4/30  brain]
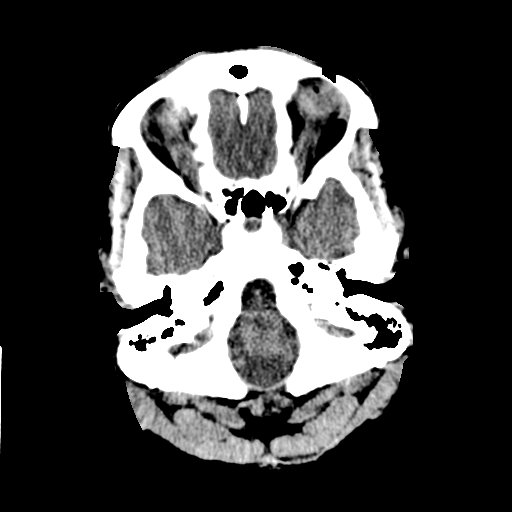
[im 6/30  brain]
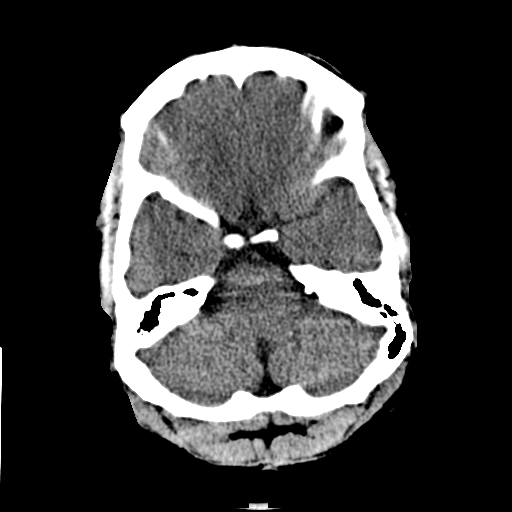
[im 8/30  brain]
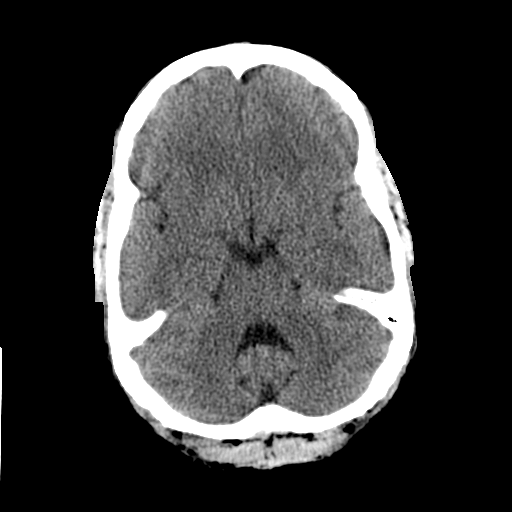
[im 9/30  brain]
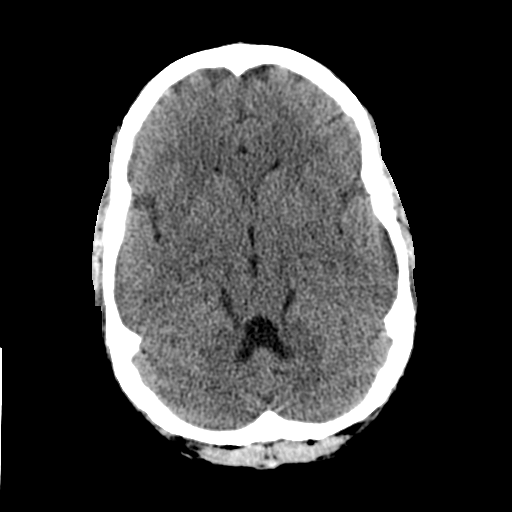
[im 9/30  bone]
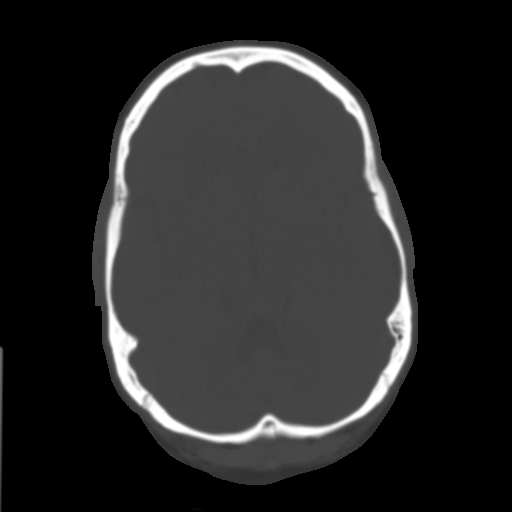
[im 11/30  brain]
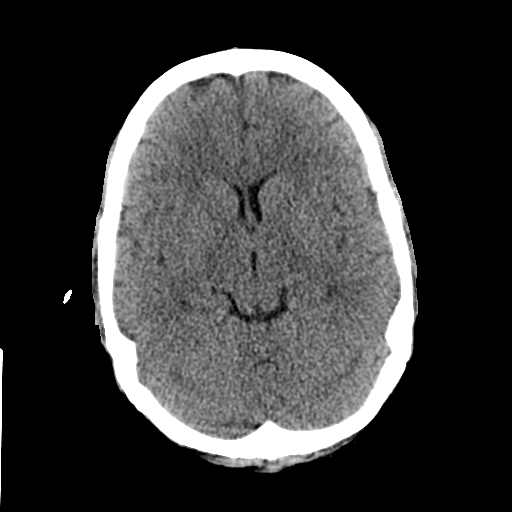
[im 13/30  brain]
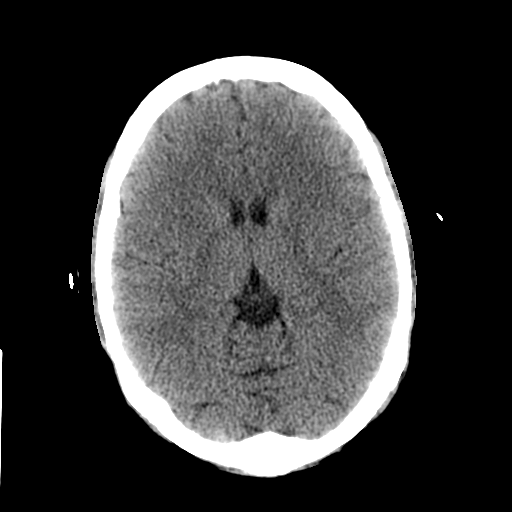
[im 15/30  brain]
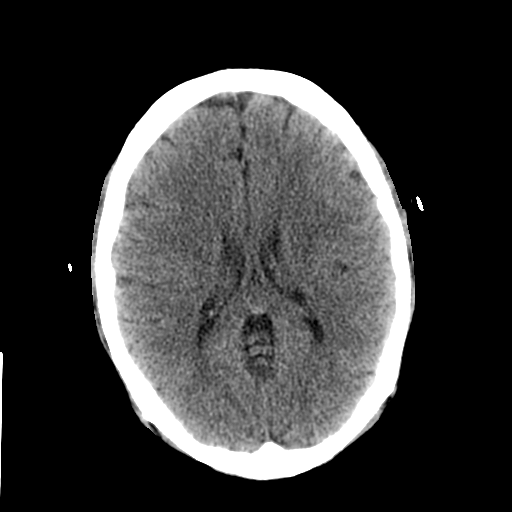
[im 16/30  brain]
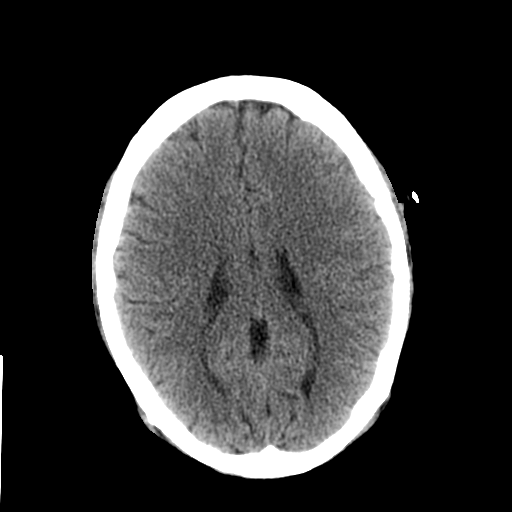
[im 16/30  bone]
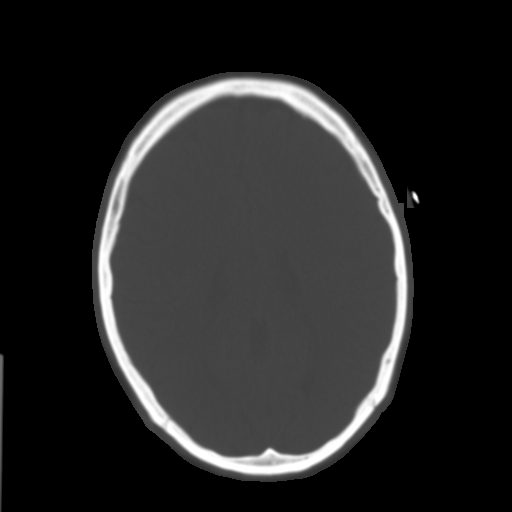
[im 18/30  brain]
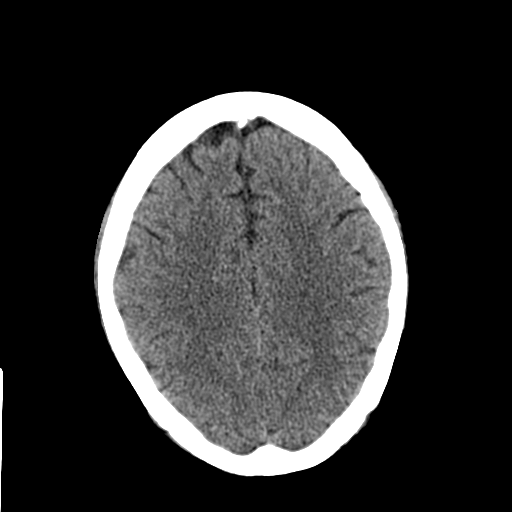
[im 20/30  brain]
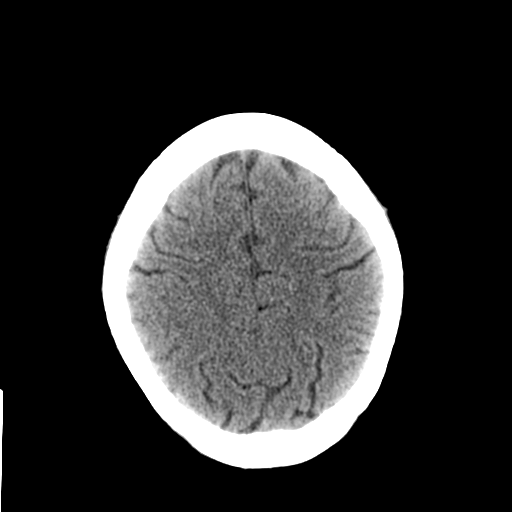
[im 22/30  brain]
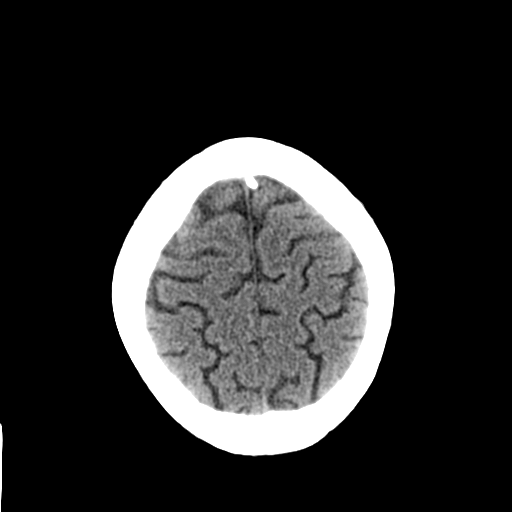
[im 23/30  brain]
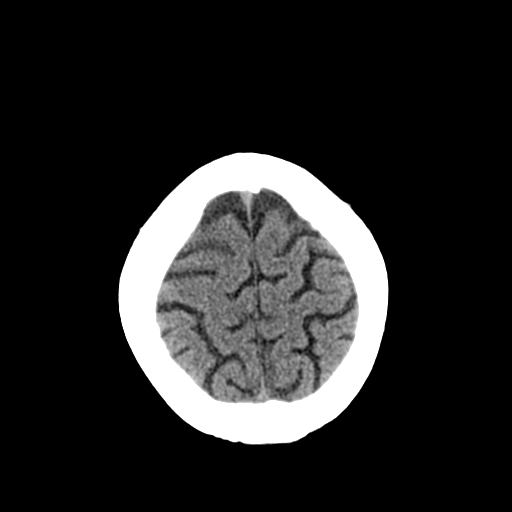
[im 23/30  bone]
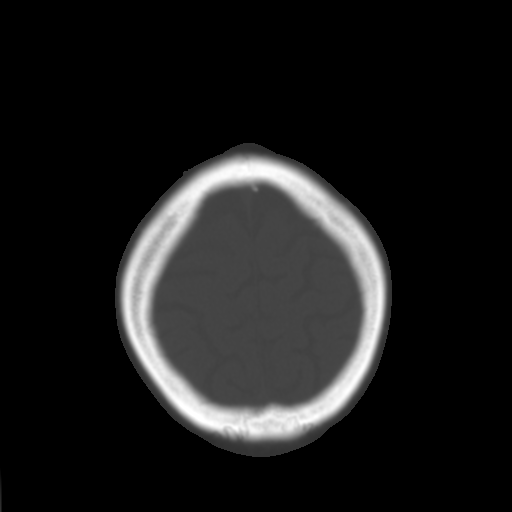
[im 25/30  brain]
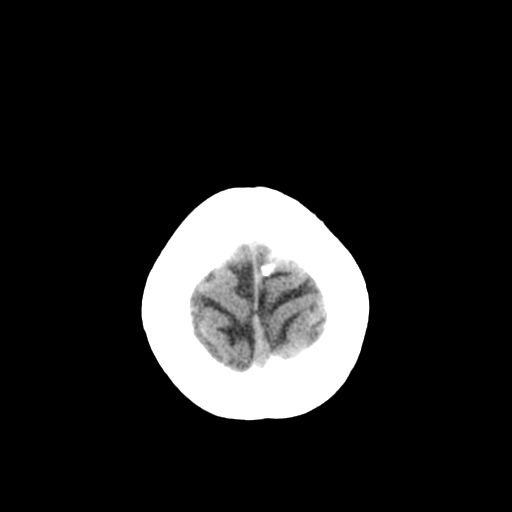
[im 27/30  brain]
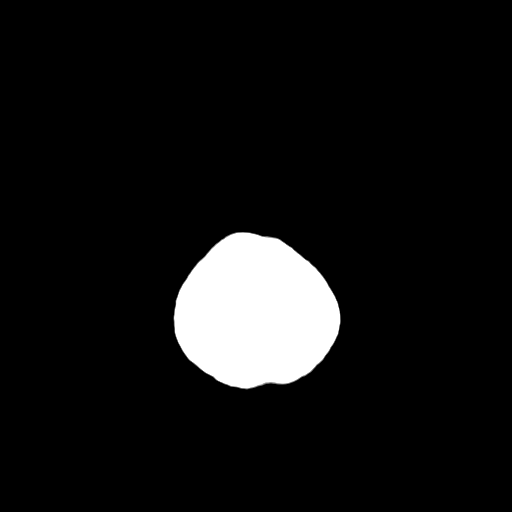
[im 29/30  brain]
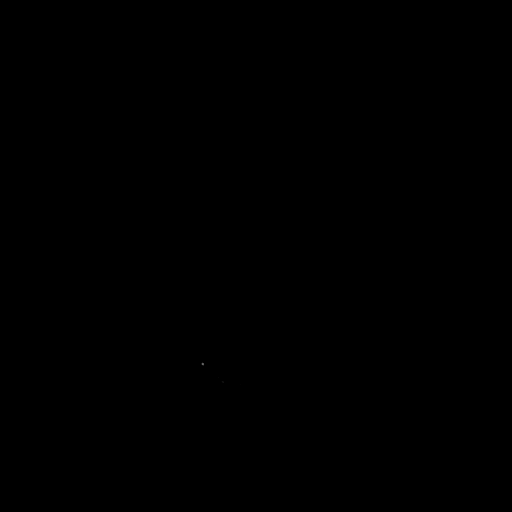

[16 of 30 positions shown; findings below may reference images not displayed]

FINDINGS: The ventricles are normal.  No extra-axial fluid
collections are seen.  The brainstem and cerebellum are
unremarkable.  No acute intracranial findings such as infarction or
hemorrhage.  No mass lesions.

The bony calvarium is intact.  The visualized paranasal sinuses and
mastoid air cells are clear.
IMPRESSION: No acute intracranial findings or mass lesions.

## 2013-02-02 ENCOUNTER — Ambulatory Visit: Payer: Self-pay

## 2013-02-09 ENCOUNTER — Ambulatory Visit: Payer: Self-pay

## 2013-02-15 ENCOUNTER — Ambulatory Visit: Payer: Self-pay

## 2013-03-15 ENCOUNTER — Ambulatory Visit: Payer: Self-pay

## 2013-09-11 ENCOUNTER — Telehealth: Payer: Self-pay | Admitting: Family Medicine

## 2013-09-11 ENCOUNTER — Ambulatory Visit (INDEPENDENT_AMBULATORY_CARE_PROVIDER_SITE_OTHER): Payer: Self-pay | Admitting: Family Medicine

## 2013-09-11 ENCOUNTER — Encounter: Payer: Self-pay | Admitting: Family Medicine

## 2013-09-11 VITALS — BP 136/80 | HR 68 | Temp 98.2°F | Ht 64.0 in | Wt 271.5 lb

## 2013-09-11 DIAGNOSIS — R51 Headache: Secondary | ICD-10-CM

## 2013-09-11 DIAGNOSIS — R519 Headache, unspecified: Secondary | ICD-10-CM | POA: Insufficient documentation

## 2013-09-11 DIAGNOSIS — R202 Paresthesia of skin: Secondary | ICD-10-CM

## 2013-09-11 DIAGNOSIS — D259 Leiomyoma of uterus, unspecified: Secondary | ICD-10-CM

## 2013-09-11 DIAGNOSIS — E669 Obesity, unspecified: Secondary | ICD-10-CM

## 2013-09-11 DIAGNOSIS — D509 Iron deficiency anemia, unspecified: Secondary | ICD-10-CM

## 2013-09-11 DIAGNOSIS — N920 Excessive and frequent menstruation with regular cycle: Secondary | ICD-10-CM

## 2013-09-11 DIAGNOSIS — D649 Anemia, unspecified: Secondary | ICD-10-CM

## 2013-09-11 DIAGNOSIS — R209 Unspecified disturbances of skin sensation: Secondary | ICD-10-CM

## 2013-09-11 DIAGNOSIS — I10 Essential (primary) hypertension: Secondary | ICD-10-CM

## 2013-09-11 LAB — BASIC METABOLIC PANEL
BUN: 7 mg/dL (ref 6–23)
CHLORIDE: 103 meq/L (ref 96–112)
CO2: 29 mEq/L (ref 19–32)
CREATININE: 1 mg/dL (ref 0.4–1.2)
Calcium: 9 mg/dL (ref 8.4–10.5)
GFR: 80.12 mL/min (ref >60.00)
Glucose, Bld: 88 mg/dL (ref 70–99)
POTASSIUM: 3.8 meq/L (ref 3.5–5.1)
Sodium: 138 mEq/L (ref 135–145)

## 2013-09-11 LAB — CBC WITH DIFFERENTIAL/PLATELET
BASOS ABS: 0 10*3/uL (ref 0.0–0.1)
Basophils Relative: 0.5 % (ref 0.0–3.0)
EOS ABS: 0.1 10*3/uL (ref 0.0–0.7)
Eosinophils Relative: 2.5 % (ref 0.0–5.0)
HCT: 32.5 % — ABNORMAL LOW (ref 36.0–46.0)
Hemoglobin: 10.3 g/dL — ABNORMAL LOW (ref 12.0–15.0)
LYMPHS PCT: 34.4 % (ref 12.0–46.0)
Lymphs Abs: 1.9 10*3/uL (ref 0.7–4.0)
MCHC: 31.6 g/dL (ref 30.0–36.0)
MCV: 77.3 fl — ABNORMAL LOW (ref 78.0–100.0)
MONO ABS: 0.4 10*3/uL (ref 0.1–1.0)
Monocytes Relative: 8.2 % (ref 3.0–12.0)
NEUTROS PCT: 54.4 % (ref 43.0–77.0)
Neutro Abs: 3 10*3/uL (ref 1.4–7.7)
PLATELETS: 250 10*3/uL (ref 150.0–400.0)
RBC: 4.21 Mil/uL (ref 3.87–5.11)
RDW: 17.9 % — AB (ref 11.5–14.6)
WBC: 5.5 10*3/uL (ref 4.5–10.5)

## 2013-09-11 LAB — TSH: TSH: 0.89 u[IU]/mL (ref 0.35–5.50)

## 2013-09-11 MED ORDER — NAPROXEN 500 MG PO TABS: 500.0000 mg | ORAL_TABLET | Freq: Two times a day (BID) | ORAL | Status: DC

## 2013-09-11 MED ORDER — TRANEXAMIC ACID 650 MG PO TABS: 1300.0000 mg | ORAL_TABLET | Freq: Three times a day (TID) | ORAL | Status: DC

## 2013-09-11 NOTE — Progress Notes (Signed)
BP 136/80  Pulse 68  Temp(Src) 98.2 F (36.8 C) (Oral)  Ht 5\' 4"  (1.626 m)  Wt 271 lb 8 oz (123.152 kg)  BMI 46.58 kg/m2  SpO2 98%  LMP 09/09/2013   CC: new pt to establish  Subjective:    Patient ID: Cassie Mills, female    DOB: 1976/04/10, 38 y.o.   MRN: [id number]  HPI: Cassie Mills is a 38 y.o. female presenting on 09/11/2013 for Establish Care, Emesis and Menorrhagia   Raechell presents today with several concerns.  Currently with no insurance but she is working regularly.  HTN - ongoing for 5 years.  Compliant with amlodipine 5mg  and lisinopril 20mg  daily.  No HA, vision changes, CP/tightness, SOB, leg swelling.  ?cluster headaches.  H/o menorrhagia from uterine fibroids s/p myomectomy Cassie Mills) 1997 with h/o anemia.  Prior treated with depo provera which helped with pain but increased bleeding.  Occasional spotting between periods.  Describes ache lower pelvic region that starts 2 weeks prior to period, at times pain leads to vomiting and has missed 2 days of work in the past week due to pain.  [facility].  Aleve helps control pain some.  H/o anemia - denies current dizziness or lightheadedness.  + fatigue.  Occasional headaches and weakness.  LMP - started 2 days ago  L ankle pain - over last few months.  Pain at posterior heel to calf.  Denies inciting trauma.  Body mass index is 46.58 kg/(m^2).  Currently sexually active, uses condoms but not 100%.  Currently on period, regular periods. No h/o STDs, denies concerns for this.  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  Allergies and medications reviewed and updated. No current outpatient prescriptions on file prior to visit.   No current facility-administered  medications on file prior to visit.    Review of Systems Per HPI unless specifically indicated above    Objective:    BP 136/80  Pulse 68  Temp(Src) 98.2 F (36.8 C) (Oral)  Ht 5\' 4"  (1.626 m)  Wt 271 lb 8 oz (123.152 kg)  BMI 46.58 kg/m2  SpO2 98%  LMP 09/09/2013  Physical Exam  Nursing note and vitals reviewed. Constitutional: She is oriented to person, place, and time. She appears well-developed and well-nourished. No distress.  HENT:  Head: Normocephalic and atraumatic.  Right Ear: Hearing, tympanic membrane, external ear and ear canal normal.  Left Ear: Hearing, tympanic membrane, external ear and ear canal normal.  Nose: Nose normal.  Mouth/Throat: Uvula is midline, oropharynx is clear and moist and mucous membranes are normal. No oropharyngeal exudate, posterior oropharyngeal edema or posterior oropharyngeal erythema.  Eyes: Conjunctivae and EOM are normal. Pupils are equal, round, and reactive to light. No scleral icterus.  Neck: Normal range of motion. Neck supple. No thyromegaly present.  Cardiovascular: Normal rate, regular rhythm, normal heart sounds and intact distal pulses.   No murmur heard. Pulses:      Radial pulses are 2+ on the right side, and 2+ on the left side.  Pulmonary/Chest: Effort normal and breath sounds normal. No respiratory distress. She has no wheezes. She has no rales.  Abdominal: Soft. Bowel sounds are normal. She exhibits no distension and no mass. There is tenderness (mild LLQ and suprapubic). There is no rebound  and no guarding.  Musculoskeletal: Normal range of motion. She exhibits no edema.  2+ rad pulses  Lymphadenopathy:    She has no cervical adenopathy.  Neurological: She is alert and oriented to person, place, and time.  CN grossly intact, station and gait intact neg phalen or tinel Sensation intact  Skin: Skin is warm and dry. No rash noted.  Psychiatric: She has a normal mood and affect. Her behavior is normal. Judgment and  thought content normal.       Assessment & Plan:   Problem List Items Addressed This Visit   ANEMIA, IRON DEFICIENCY     Recheck CBC today, then discuss possible tranexemic acid.  Continue iron.    Relevant Medications      ferrous sulfate 325 (65 FE) MG tablet   Frequent headaches   Relevant Medications      amLODipine (NORVASC) 5 MG tablet      naproxen (NAPROSYN) tablet   HYPERTENSION     Chronic, stable. Continue current regimen.  Pt does not currently need refills.    Relevant Medications      lisinopril (PRINIVIL,ZESTRIL) 20 MG tablet      tranexamic acid (LYSTEDA) 650 MG TABS tablet   Menorrhagia     Overall stable currently - denies anemia sxs - will check CBC today. Will also refer to OBGYN per pt request.    Relevant Orders      Ambulatory referral to Gynecology   Paresthesia of both hands     L>R.  Denies cervical sxs such as neck pain or radiculopathy. Anticipate CTS given symptomatology endorsed - discussed this as well as recommended night time wrist brace. Reassess next visit.    Uterine fibroid - Primary     Reviewed treatment options, will treat painful periods with naprosyn with close monitoring of blood pressure. Pt requests referral to OBGYN to discuss further treatment options. Provided with Rx for tranexamic acid to price out with discussion on use for heavy bleed, but will await CBC prior to recommending filling.    Relevant Orders      Ambulatory referral to Gynecology    Other Visit Diagnoses   Hypertension        Anemia        Relevant Orders       CBC with Differential       Ambulatory referral to Gynecology    Obesity        Relevant Orders       Basic metabolic panel       TSH        Follow up plan: Return if symptoms worsen or fail to improve.

## 2013-09-11 NOTE — Progress Notes (Signed)
Pre visit review using our clinic review tool, if applicable. No additional management support is needed unless otherwise documented below in the visit note. 

## 2013-09-11 NOTE — Assessment & Plan Note (Addendum)
Overall stable currently - denies anemia sxs - will check CBC today. Will also refer to OBGYN per pt request.

## 2013-09-11 NOTE — Assessment & Plan Note (Signed)
L>R.  Denies cervical sxs such as neck pain or radiculopathy. Anticipate CTS given symptomatology endorsed - discussed this as well as recommended night time wrist brace. Reassess next visit.

## 2013-09-11 NOTE — Assessment & Plan Note (Signed)
Recheck CBC today, then discuss possible tranexemic acid.  Continue iron.

## 2013-09-11 NOTE — Assessment & Plan Note (Signed)
Check glu fasting today.

## 2013-09-11 NOTE — Telephone Encounter (Signed)
Relevant patient education assigned to patient using Emmi. ° °

## 2013-09-11 NOTE — Patient Instructions (Signed)
Blood work today. For heavy bleeding with periods - price out tranexamic acid to help decrease bleed - take three times daily during max 5 days of your cycle.  May use aleve or prescription strength aleve (prescription sent to pharmacy) for abdominal pain/ cramping. I have requested records from [facility]. Ultimate treatment may be surgery/procedure - if you'd like referral back to OBGYN let me know.  Menorrhagia Menorrhagia is the medical term for when your menstrual periods are heavy or last longer than usual. With menorrhagia, every period you have may cause enough blood loss and cramping that you are unable to maintain your usual activities. CAUSES  In some cases, the cause of heavy periods is unknown, but a number of conditions may cause menorrhagia. Common causes include:  A problem with the hormone-producing thyroid gland (hypothyroid).  Noncancerous growths in the uterus (polyps or fibroids).  An imbalance of the estrogen and progesterone hormones.  One of your ovaries not releasing an egg during one or more months.  Side effects of having an intrauterine device (IUD).  Side effects of some medicines, such as anti-inflammatory medicines or blood thinners.  A bleeding disorder that stops your blood from clotting normally. SIGNS AND SYMPTOMS  During a normal period, bleeding lasts between 4 and 8 days. Signs that your periods are too heavy include:  You routinely have to change your pad or tampon every 1 or 2 hours because it is completely soaked.  You pass blood clots larger than 1 inch (2.5 cm) in size.  You have bleeding for more than 7 days.  You need to use pads and tampons at the same time because of heavy bleeding.  You need to wake up to change your pads or tampons during the night.  You have symptoms of anemia, such as tiredness, fatigue, or shortness of breath. DIAGNOSIS  Your health care provider will perform a physical exam and ask you questions about  your symptoms and menstrual history. Other tests may be ordered based on what the health care provider finds during the exam. These tests can include:  Blood tests To check if you are pregnant or have hormonal changes, a bleeding or thyroid disorder, low iron levels (anemia), or other problems.  Endometrial biopsy Your health care provider takes a sample of tissue from the inside of your uterus to be examined under a microscope.  Pelvic ultrasound This test uses sound waves to make a picture of your uterus, ovaries, and vagina. The pictures can show if you have fibroids or other growths.  Hysteroscopy For this test, your health care provider will use a small telescope to look inside your uterus. Based on the results of your initial tests, your health care provider may recommend further testing. TREATMENT  Treatment may not be needed. If it is needed, your health care provider may recommend treatment with one or more medicines first. If these do not reduce bleeding enough, a surgical treatment might be an option. The best treatment for you will depend on:   Whether you need to prevent pregnancy.  Your desire to have children in the future.  The cause and severity of your bleeding.  Your opinion and personal preference.  Medicines for menorrhagia may include:  Birth control methods that use hormones These include the pill, skin patch, vaginal ring, shots that you get every 3 months, hormonal IUD, and implant. These treatments reduce bleeding during your menstrual period.  Medicines that thicken blood and slow bleeding.  Medicines that reduce  swelling, such as ibuprofen.  Medicines that contain a synthetic hormone called progestin.   Medicines that make the ovaries stop working for a short time.  You may need surgical treatment for menorrhagia if the medicines are unsuccessful. Treatment options include:  Dilation and curettage (D&C) In this procedure, your health care provider  opens (dilates) your cervix and then scrapes or suctions tissue from the lining of your uterus to reduce menstrual bleeding.  Operative hysteroscopy This procedure uses a tiny tube with a light (hysteroscope) to view your uterine cavity and can help in the surgical removal of a polyp that may be causing heavy periods.  Endometrial ablation Through various techniques, your health care provider permanently destroys the entire lining of your uterus (endometrium). After endometrial ablation, most women have little or no menstrual flow. Endometrial ablation reduces your ability to become pregnant.  Endometrial resection This surgical procedure uses an electrosurgical wire loop to remove the lining of the uterus. This procedure also reduces your ability to become pregnant.  Hysterectomy Surgical removal of the uterus and cervix is a permanent procedure that stops menstrual periods. Pregnancy is not possible after a hysterectomy. This procedure requires anesthesia and hospitalization. HOME CARE INSTRUCTIONS   Only take over-the-counter or prescription medicines as directed by your health care provider. Take prescribed medicines exactly as directed. Do not change or switch medicines without consulting your health care provider.  Take any prescribed iron pills exactly as directed by your health care provider. Long-term heavy bleeding may result in low iron levels. Iron pills help replace the iron your body lost from heavy bleeding. Iron may cause constipation. If this becomes a problem, increase the bran, fruits, and roughage in your diet.  Do not take aspirin or medicines that contain aspirin 1 week before or during your menstrual period. Aspirin may make the bleeding worse.  If you need to change your sanitary pad or tampon more than once every 2 hours, stay in bed and rest as much as possible until the bleeding stops.  Eat well-balanced meals. Eat foods high in iron. Examples are leafy green vegetables,  meat, liver, eggs, and whole grain breads and cereals. Do not try to lose weight until the abnormal bleeding has stopped and your blood iron level is back to normal. SEEK MEDICAL CARE IF:   You soak through a pad or tampon every 1 or 2 hours, and this happens every time you have a period.  You need to use pads and tampons at the same time because you are bleeding so much.  You need to change your pad or tampon during the night.  You have a period that lasts for more than 8 days.  You pass clots bigger than 1 inch wide.  You have irregular periods that happen more or less often than once a month.  You feel dizzy or faint.  You feel very weak or tired.  You feel short of breath or feel your heart is beating too fast when you exercise.  You have nausea and vomiting or diarrhea while you are taking your medicine.  You have any problems that may be related to the medicine you are taking. SEEK IMMEDIATE MEDICAL CARE IF:   You soak through 4 or more pads or tampons in 2 hours.  You have any bleeding while you are pregnant. MAKE SURE YOU:   Understand these instructions.  Will watch your condition.  Will get help right away if you are not doing well or get worse.  Document Released: 06/07/2005 Document Revised: 03/28/2013 Document Reviewed: 11/26/2012 [facility] Patient Information 2014 [place]. Uterine Fibroid A uterine fibroid is a growth (tumor) that occurs in your uterus. This type of tumor is not cancerous and does not spread out of the uterus. You can have one or many fibroids. Fibroids can vary in size, weight, and where they grow in the uterus. Some can become quite large. Most fibroids do not require medical treatment, but some can cause pain or heavy bleeding during and between periods. CAUSES  A fibroid is the result of a single uterine cell that keeps growing (unregulated), which is different than most cells in the human body. Most cells have a control mechanism that  keeps them from reproducing without control.  SIGNS AND SYMPTOMS   Bleeding.  Pelvic pain and pressure.  Bladder problems due to the size of the fibroid.  Infertility and miscarriages depending on the size and location of the fibroid. DIAGNOSIS  Uterine fibroids are diagnosed through a physical exam. Your health care provider may feel the lumpy tumors during a pelvic exam. Ultrasonography may be done to get information regarding size, location, and number of tumors.  TREATMENT   Your health care provider may recommend watchful waiting. This involves getting the fibroid checked by your health care provider to see if it grows or shrinks.   Hormone treatment or an intrauterine device (IUD) may be prescribed.   Surgery may be needed to remove the fibroids (myomectomy) or the uterus (hysterectomy). This depends on your situation. When fibroids interfere with fertility and a woman wants to become pregnant, a health care provider may recommend having the fibroids removed.  [employer] care depends on how you were treated. In general:   Keep all follow-up appointments with your health care provider.   Only take over-the-counter or prescription medicines as directed by your health care provider. If you were prescribed a hormone treatment, take the hormone medicines exactly as directed. Do not take aspirin. It can cause bleeding.   Talk to your health care provider about taking iron pills.  If your periods are troublesome but not so heavy, lie down with your feet raised slightly above your heart. Place cold packs on your lower abdomen.   If your periods are heavy, write down the number of pads or tampons you use per month. Bring this information to your health care provider.   Include green vegetables in your diet.  SEEK IMMEDIATE MEDICAL CARE IF:  You have pelvic pain or cramps not controlled with medicines.   You have a sudden increase in pelvic pain.   You  have an increase in bleeding between and during periods.   You have excessive periods and soak tampons or pads in a half hour or less.  You feel lightheaded or have fainting episodes. Document Released: 06/04/2000 Document Revised: 03/28/2013 Document Reviewed: 01/04/2013 [facility] Patient Information 2014 [place], [place].

## 2013-09-11 NOTE — Assessment & Plan Note (Signed)
Reviewed treatment options, will treat painful periods with naprosyn with close monitoring of blood pressure. Pt requests referral to OBGYN to discuss further treatment options. Provided with Rx for tranexamic acid to price out with discussion on use for heavy bleed, but will await CBC prior to recommending filling.

## 2013-09-11 NOTE — Assessment & Plan Note (Signed)
Chronic, stable. Continue current regimen.  Pt does not currently need refills.

## 2013-10-01 ENCOUNTER — Encounter: Payer: Self-pay | Admitting: Obstetrics & Gynecology

## 2013-10-01 ENCOUNTER — Ambulatory Visit (INDEPENDENT_AMBULATORY_CARE_PROVIDER_SITE_OTHER): Payer: Self-pay | Admitting: Obstetrics & Gynecology

## 2013-10-01 VITALS — BP 160/80 | HR 86 | Ht 64.0 in | Wt 273.8 lb

## 2013-10-01 DIAGNOSIS — N920 Excessive and frequent menstruation with regular cycle: Secondary | ICD-10-CM

## 2013-10-01 DIAGNOSIS — D259 Leiomyoma of uterus, unspecified: Secondary | ICD-10-CM

## 2013-10-01 MED ORDER — MEGESTROL ACETATE 40 MG PO TABS: 80.0000 mg | ORAL_TABLET | Freq: Two times a day (BID) | ORAL | Status: DC

## 2013-10-01 NOTE — Progress Notes (Signed)
Pt reports that when she bleeds she saturates a pad in about 40 minutes. She does have spotting at times throughout the month.

## 2013-10-01 NOTE — Patient Instructions (Signed)
Uterine Fibroid A uterine fibroid is a growth (tumor) that occurs in your uterus. This type of tumor is not cancerous and does not spread out of the uterus. You can have one or many fibroids. Fibroids can vary in size, weight, and where they grow in the uterus. Some can become quite large. Most fibroids do not require medical treatment, but some can cause pain or heavy bleeding during and between periods. CAUSES  A fibroid is the result of a single uterine cell that keeps growing (unregulated), which is different than most cells in the human body. Most cells have a control mechanism that keeps them from reproducing without control.  SIGNS AND SYMPTOMS   Bleeding.  Pelvic pain and pressure.  Bladder problems due to the size of the fibroid.  Infertility and miscarriages depending on the size and location of the fibroid. DIAGNOSIS  Uterine fibroids are diagnosed through a physical exam. Your health care provider may feel the lumpy tumors during a pelvic exam. Ultrasonography may be done to get information regarding size, location, and number of tumors.  TREATMENT   Your health care provider may recommend watchful waiting. This involves getting the fibroid checked by your health care provider to see if it grows or shrinks.   Hormone treatment or an intrauterine device (IUD) may be prescribed.   Surgery may be needed to remove the fibroids (myomectomy) or the uterus (hysterectomy). This depends on your situation. When fibroids interfere with fertility and a woman wants to become pregnant, a health care provider may recommend having the fibroids removed.  HOME CARE INSTRUCTIONS  Home care depends on how you were treated. In general:   Keep all follow-up appointments with your health care provider.   Only take over-the-counter or prescription medicines as directed by your health care provider. If you were prescribed a hormone treatment, take the hormone medicines exactly as directed. Do not  take aspirin. It can cause bleeding.   Talk to your health care provider about taking iron pills.  If your periods are troublesome but not so heavy, lie down with your feet raised slightly above your heart. Place cold packs on your lower abdomen.   If your periods are heavy, write down the number of pads or tampons you use per month. Bring this information to your health care provider.   Include green vegetables in your diet.  SEEK IMMEDIATE MEDICAL CARE IF:  You have pelvic pain or cramps not controlled with medicines.   You have a sudden increase in pelvic pain.   You have an increase in bleeding between and during periods.   You have excessive periods and soak tampons or pads in a half hour or less.  You feel lightheaded or have fainting episodes. Document Released: 06/04/2000 Document Revised: 03/28/2013 Document Reviewed: 01/04/2013 [employer] Patient Information 2014 [employer]. Uterine Artery Embolization for Fibroids Uterine artery embolization is a nonsurgical treatment to shrink fibroids. A thin plastic tube (catheter) is used to inject material that blocks off the blood supply to the fibroid, which causes the fibroid to shrink. LET YOUR HEALTH CARE PROVIDER KNOW ABOUT:  Any allergies you have.  All medicines you are taking, including vitamins, herbs, eye drops, creams, and over-the-counter medicines.  Previous problems you or members of your family have had with the use of anesthetics.  Any blood disorders you have.  Previous surgeries you have had.  Medical conditions you have. RISKS AND COMPLICATIONS  Injury to the uterus from decreased blood supply  Infection.    Blood infection (septicemia).  Lack of menstrual periods (amenorrhea).  Death of tissue cells (necrosis) around your bladder or vulva.  Development of a hole between organs or from an organ to the surface of your skin (fistula).  Blood clot in the legs (deep vein thrombosis) or lung  (pulmonary embolus). BEFORE THE PROCEDURE  Ask your health care provider about changing or stopping your regular medicines.   Do not take aspirin or blood thinners (anticoagulants) for 1 week before the surgery or as directed by your health care provider.  Do not eat or drink anything for 8 hours before the surgery or as directed by your health care provider.   Empty your bladder before the procedure begins. PROCEDURE   An IV tube will be placed into one of your veins. This will be used to give you a sedative and pain medication (conscious sedation).  You will be given a medicine that numbs the area (local anesthetic).  A small cut will be made in your groin. A catheter is then inserted into the main artery of your leg.  The catheter will be guided through the artery to your uterus. A series of images will be taken while dye is injected through the catheter in your groin. X-rays are taken at the same time. This is done to provide a road map of the blood supply to your uterus and fibroids.  Tiny plastic spheres, about the size of sand grains, will be injected through the catheter. Metal coils may be used to help block the artery. The particles will lodge in tiny branches of the uterine artery that supplies blood to the fibroids.  The procedure is repeated on the artery that supplies the other side of the uterus.  The catheter is then removed and pressure is held to stop any bleeding. No stitches are needed.  A dressing is then placed over the cut (incision). AFTER THE PROCEDURE  You will be taken to a recovery area where your progress will be monitored until you are awake, stable, and taking fluids well. If there are no other problems, you will then be moved to a regular hospital room.  You will be observed overnight in the hospital.  You will have cramping that should be controlled with pain medication. Document Released: 08/23/2005 Document Revised: 03/28/2013 Document Reviewed:  12/21/2012 [employer] Patient Information 2014 [employer].  

## 2013-10-01 NOTE — Progress Notes (Signed)
   CLINIC ENCOUNTER NOTE  History:  38 y.o. G0 here today for evaluation and management for menorrhagia and fibroids.  Declines endometrial biopsy. No anemia symptoms. History of myomectomy in 1997 by Dr. Ulanda Edison. Patient is considering surgery for now, feels that she has tried Lupron in the past which "did not shrink the fibroids and caused six months of irregular bleeding". She also wants to discuss other options. Currently on Lysteda.  The following portions of the patient's history were reviewed and updated as appropriate: allergies, current medications, past family history, past medical history, past social history, past surgical history and problem list. Patient does not remember when she last had a pap smear, there was a normal pap smear in 10/16/07 on chart review.  Review of Systems:  Pertinent items are noted in HPI.  Objective:  Physical Exam BP 160/80  Pulse 86  Ht 5\' 4"  (1.626 m)  Wt 273 lb 12.8 oz (124.195 kg)  BMI 46.97 kg/m2  LMP 09/09/2013 Gen: NAD Abd: Soft, obese, nontender and nondistended. Unable to fully palpate uterus due to habitus. Pelvic: Deferred as per patient  Labs and Imaging 09/12/18 15 Hgb 10.3, TSH 0.89  01/23/2010 TRANSABDOMINAL ULTRASOUND OF PELVIS  Clinical Data: Evaluate fibroids. Pelvic pain with spotting between menses. Post fibroid removal in 1997. LMP 01/12/2010 Comparison: 05/13/2008 and 02/17/1999  Findings: Uterus demonstrates a sagittal length of approximately 15 cm, an AP depth of 9.4 cm and a transverse width of 8.3 cm. Large focal posterior fibroid is again identified measuring 8.1 x 8.1 by 8.3 cm. This has increased only slightly in size in comparison with the most recent exam from 2009. Endometrium is deviated anteriorly by the aforementioned fibroid and appears thin with an AP width of 4.0 cm. Right Ovary is not seen with confidence. Left Ovary has a normal appearance measuring 3.1 x 2.6 x 2.2 cm. Other Findings: No pelvic fluid is seen    IMPRESSION: Persistent focal fibroid demonstrating little interval change in size since 2009. No definite focal endometrial abnormalities are suggested on the transabdominal evaluation. Normal left ovary and non-visualized right ovary.   Assessment & Plan:  Discussed different management modalities: offered Lupron with addback therapy (Norethindrone) which may help offset the irregular bleeding, progestin only therapy with Megestrol, repeat myomectomy or hysterectomy. Patient reports she is not interested in getting pregnant/having children.  Discussed possibility of intraabdominal adhesive disease which may make surgery difficult, also risk of hysterectomy in the event of significant, concerning bleeding during a myomectomy.  Also discussed uterine fibroid embolization, informed her that IR performs this procedure. All questions answered. Emphasized need for pap smear, endometrial biopsy prior to surgery to rule out hyperplasia/neoplasia and repeat ultrasound.  Patient reported that she will get the repeat ultrasound and try Megestrol for now, and will fill out Lupron application.  She will think about other options and will let us know what she decides.   Megace prescribed.  Repeat pelvic ultrasound ordered.  Bleeding precautions reviewed.    Verita Schneiders, MD, [place] Attending [place], [place]

## 2013-10-08 ENCOUNTER — Ambulatory Visit (HOSPITAL_COMMUNITY): Payer: Self-pay

## 2013-10-18 ENCOUNTER — Telehealth: Payer: Self-pay

## 2013-10-18 ENCOUNTER — Telehealth: Payer: Self-pay | Admitting: *Deleted

## 2013-10-18 NOTE — Telephone Encounter (Signed)
Called pt in concern of her depo lupron application pt informed me that she had brought in a letter to [place] from the person that she lives with.  Confirmed with Antoinette and it was scanned into patients chart.  Faxed lupron application with letter.  Advised pt that she will receive a letter in mail concerning approval, when she gets that letter to call us if she has not heard anything from our office concerning setting up an appt.  Pt stated understanding with no further questions.

## 2013-10-18 NOTE — Telephone Encounter (Signed)
Patient is calling to find out that status of what is going on with her lupron application. She states that it has been a while and she hasn't heard anything.

## 2013-10-18 NOTE — Telephone Encounter (Signed)
Called patient and told her that one of our nurses faxed it earlier today and she will get a letter in the mail when it has been approved. Patient verbalized understanding and had no further questions

## 2013-10-22 ENCOUNTER — Ambulatory Visit (HOSPITAL_COMMUNITY): Admission: RE | Admit: 2013-10-22 | Payer: Self-pay | Source: Ambulatory Visit

## 2013-11-16 ENCOUNTER — Telehealth: Payer: Self-pay

## 2013-11-16 MED ORDER — LISINOPRIL 20 MG PO TABS: 20.0000 mg | ORAL_TABLET | Freq: Two times a day (BID) | ORAL | Status: DC

## 2013-11-16 NOTE — Telephone Encounter (Signed)
Pt request refill for lisinopril to [facility] [address]. Advised done.

## 2013-11-21 MED ORDER — LISINOPRIL-HYDROCHLOROTHIAZIDE 20-12.5 MG PO TABS: 1.0000 | ORAL_TABLET | Freq: Every day | ORAL | Status: DC

## 2013-11-21 NOTE — Telephone Encounter (Signed)
Notified Margarita Grizzle at [facility] [address] new rx sent for lisinopril HCTZ and to d/c lisinopril rx. Margarita Grizzle voiced understanding.Left v/m for pt that lisinopril rx was d/c and lisinopril HCTZ 20/12.5mg  sent to [facility] [address].any questions pt to cb.

## 2013-11-21 NOTE — Telephone Encounter (Signed)
Sent in lisinopril hctz 20/12.5mg  bid and cancelled lisinopril. plz let pt or pharmacy know.

## 2013-11-21 NOTE — Addendum Note (Signed)
Addended by: Ria Bush on: 11/21/2013 01:04 PM   Modules accepted: Orders, Medications

## 2013-11-21 NOTE — Telephone Encounter (Signed)
Pt left v/m requesting cb and leave message what call is in ref to.left v/m advising pt need which med pt is taking lisinopril or lisinopril HCTZ.

## 2013-11-21 NOTE — Telephone Encounter (Signed)
Pt left v/m that [facility] did not have lisinopril rx. Spoke with Velna Hatchet at [address] and lisinopril 20 mg is ready for pick up with a note that 01/25/13 Lisinopril HCTZ was given by Dr Damaris Hippo and should pt be taking lisinopril or lisinopril HCTZ. Left v/m for pt to cb to verify what med she is taking.

## 2013-11-21 NOTE — Telephone Encounter (Signed)
Pt left v/m pt taking lisinopril HCTZ. Spoke with Brandi at [facility] pt was taking lisinopril HCTZ 20-12.5 mg one tablet twice a day. Spoke with Velna Hatchet and asked her to hold lisinopril rx that was recently sent to pharmacy. What do you want pt taking?

## 2013-11-22 ENCOUNTER — Other Ambulatory Visit: Payer: Self-pay | Admitting: *Deleted

## 2013-11-22 MED ORDER — AMLODIPINE BESYLATE 5 MG PO TABS: 5.0000 mg | ORAL_TABLET | Freq: Every day | ORAL | Status: DC

## 2013-12-04 ENCOUNTER — Encounter: Payer: Self-pay | Admitting: Family Medicine

## 2013-12-06 ENCOUNTER — Telehealth: Payer: Self-pay

## 2013-12-06 NOTE — Telephone Encounter (Signed)
Pt was seen 09/11/13 and discussed pain in hands and pt request cb about getting the hand brace that Dr Darnell Level mentioned.Pt said both hands are hurting and pain is worse at night and early AM.

## 2013-12-06 NOTE — Telephone Encounter (Signed)
?  CTS I suggest she try carpal tunnel wrist braces she can buy OTC at local pharmacy ([facility], [facility]) Would start with whichever side is more symptomatic.

## 2013-12-07 NOTE — Telephone Encounter (Signed)
Patient notified and verbalized understanding. 

## 2013-12-14 ENCOUNTER — Encounter: Payer: Self-pay | Admitting: Family Medicine

## 2014-01-05 ENCOUNTER — Other Ambulatory Visit: Payer: Self-pay | Admitting: Family Medicine

## 2014-01-05 DIAGNOSIS — I10 Essential (primary) hypertension: Secondary | ICD-10-CM

## 2014-01-05 DIAGNOSIS — E669 Obesity, unspecified: Secondary | ICD-10-CM

## 2014-01-05 DIAGNOSIS — D509 Iron deficiency anemia, unspecified: Secondary | ICD-10-CM

## 2014-01-07 ENCOUNTER — Other Ambulatory Visit (INDEPENDENT_AMBULATORY_CARE_PROVIDER_SITE_OTHER): Payer: Self-pay

## 2014-01-07 DIAGNOSIS — E669 Obesity, unspecified: Secondary | ICD-10-CM

## 2014-01-07 DIAGNOSIS — D509 Iron deficiency anemia, unspecified: Secondary | ICD-10-CM

## 2014-01-07 DIAGNOSIS — I10 Essential (primary) hypertension: Secondary | ICD-10-CM

## 2014-01-07 LAB — CBC WITH DIFFERENTIAL/PLATELET
Basophils Absolute: 0 10*3/uL (ref 0.0–0.1)
Basophils Relative: 0.4 % (ref 0.0–3.0)
EOS PCT: 1.9 % (ref 0.0–5.0)
Eosinophils Absolute: 0.1 10*3/uL (ref 0.0–0.7)
HCT: 32.8 % — ABNORMAL LOW (ref 36.0–46.0)
Hemoglobin: 10.6 g/dL — ABNORMAL LOW (ref 12.0–15.0)
Lymphocytes Relative: 30.1 % (ref 12.0–46.0)
Lymphs Abs: 1.5 10*3/uL (ref 0.7–4.0)
MCHC: 32.3 g/dL (ref 30.0–36.0)
MCV: 76.7 fl — ABNORMAL LOW (ref 78.0–100.0)
MONO ABS: 0.4 10*3/uL (ref 0.1–1.0)
MONOS PCT: 8.6 % (ref 3.0–12.0)
NEUTROS PCT: 59 % (ref 43.0–77.0)
Neutro Abs: 3 10*3/uL (ref 1.4–7.7)
PLATELETS: 252 10*3/uL (ref 150.0–400.0)
RBC: 4.28 Mil/uL (ref 3.87–5.11)
RDW: 17.8 % — AB (ref 11.5–15.5)
WBC: 5 10*3/uL (ref 4.0–10.5)

## 2014-01-07 LAB — LIPID PANEL
Cholesterol: 114 mg/dL (ref 0–200)
HDL: 43.5 mg/dL (ref >39.00)
LDL CALC: 58 mg/dL (ref 0–99)
NonHDL: 70.5
TRIGLYCERIDES: 64 mg/dL (ref 0.0–149.0)
Total CHOL/HDL Ratio: 3
VLDL: 12.8 mg/dL (ref 0.0–40.0)

## 2014-01-07 LAB — COMPREHENSIVE METABOLIC PANEL
ALK PHOS: 66 U/L (ref 39–117)
ALT: 12 U/L (ref 0–35)
AST: 18 U/L (ref 0–37)
Albumin: 3.4 g/dL — ABNORMAL LOW (ref 3.5–5.2)
BUN: 8 mg/dL (ref 6–23)
CALCIUM: 8.6 mg/dL (ref 8.4–10.5)
CHLORIDE: 108 meq/L (ref 96–112)
CO2: 24 meq/L (ref 19–32)
Creatinine, Ser: 0.8 mg/dL (ref 0.4–1.2)
GFR: 98.04 mL/min (ref >60.00)
GLUCOSE: 72 mg/dL (ref 70–99)
Potassium: 3.6 mEq/L (ref 3.5–5.1)
SODIUM: 137 meq/L (ref 135–145)
Total Bilirubin: 0.6 mg/dL (ref 0.2–1.2)
Total Protein: 6.7 g/dL (ref 6.0–8.3)

## 2014-01-07 LAB — TSH: TSH: 1.1 u[IU]/mL (ref 0.35–4.50)

## 2014-01-14 ENCOUNTER — Encounter: Payer: Self-pay | Admitting: Family Medicine

## 2014-01-16 ENCOUNTER — Telehealth: Payer: Self-pay | Admitting: *Deleted

## 2014-01-16 NOTE — Telephone Encounter (Signed)
Pt cancelled f/u appt and rescheduled for 02/2014. See result note - blood work stable, will discuss in detail at f/u visit.

## 2014-01-16 NOTE — Telephone Encounter (Signed)
    Patient calling for lab results 

## 2014-01-17 NOTE — Telephone Encounter (Signed)
Patient notified

## 2014-02-22 ENCOUNTER — Other Ambulatory Visit (HOSPITAL_COMMUNITY)
Admission: RE | Admit: 2014-02-22 | Discharge: 2014-02-22 | Disposition: A | Discharge status: Home / Self Care | Payer: Self-pay | Source: Ambulatory Visit | Attending: Family Medicine | Admitting: Family Medicine

## 2014-02-22 ENCOUNTER — Encounter: Payer: Self-pay | Admitting: Family Medicine

## 2014-02-22 ENCOUNTER — Ambulatory Visit (INDEPENDENT_AMBULATORY_CARE_PROVIDER_SITE_OTHER): Payer: Self-pay | Admitting: Family Medicine

## 2014-02-22 VITALS — BP 126/84 | HR 64 | Temp 98.2°F | Ht 64.0 in | Wt 270.0 lb

## 2014-02-22 DIAGNOSIS — Z1151 Encounter for screening for human papillomavirus (HPV): Secondary | ICD-10-CM | POA: Insufficient documentation

## 2014-02-22 DIAGNOSIS — D509 Iron deficiency anemia, unspecified: Secondary | ICD-10-CM

## 2014-02-22 DIAGNOSIS — N6019 Diffuse cystic mastopathy of unspecified breast: Secondary | ICD-10-CM

## 2014-02-22 DIAGNOSIS — Z124 Encounter for screening for malignant neoplasm of cervix: Secondary | ICD-10-CM | POA: Insufficient documentation

## 2014-02-22 DIAGNOSIS — D259 Leiomyoma of uterus, unspecified: Secondary | ICD-10-CM

## 2014-02-22 DIAGNOSIS — N920 Excessive and frequent menstruation with regular cycle: Secondary | ICD-10-CM

## 2014-02-22 DIAGNOSIS — I10 Essential (primary) hypertension: Secondary | ICD-10-CM

## 2014-02-22 DIAGNOSIS — Z0000 Encounter for general adult medical examination without abnormal findings: Secondary | ICD-10-CM | POA: Insufficient documentation

## 2014-02-22 LAB — HM PAP SMEAR: HM PAP: NORMAL

## 2014-02-22 MED ORDER — NAPROXEN 500 MG PO TABS: 500.0000 mg | ORAL_TABLET | Freq: Two times a day (BID) | ORAL | Status: DC

## 2014-02-22 MED ORDER — AMLODIPINE BESYLATE 5 MG PO TABS: 5.0000 mg | ORAL_TABLET | Freq: Every day | ORAL | Status: DC

## 2014-02-22 MED ORDER — LISINOPRIL-HYDROCHLOROTHIAZIDE 20-12.5 MG PO TABS: 1.0000 | ORAL_TABLET | Freq: Every day | ORAL | Status: DC

## 2014-02-22 NOTE — Progress Notes (Signed)
Pre visit review using our clinic review tool, if applicable. No additional management support is needed unless otherwise documented below in the visit note. 

## 2014-02-22 NOTE — Progress Notes (Signed)
BP 126/84  Pulse 64  Temp(Src) 98.2 F (36.8 C) (Oral)  Ht 5\' 4"  (1.626 m)  Wt 270 lb (122.471 kg)  BMI 46.32 kg/m2  LMP 02/11/2014   CC: CPE Subjective:    Patient ID: Cassie Mills, female    DOB: 03-May-1976, 38 y.o.   MRN: [id number]  HPI: Cassie Mills is a 38 y.o. female presenting on 02/22/2014 for Annual Exam   Wt Readings from Last 3 Encounters:  02/22/14 270 lb (122.471 kg)  10/01/13 273 lb 12.8 oz (124.195 kg)  09/11/13 271 lb 8 oz (123.152 kg)  Body mass index is 46.32 kg/(m^2).   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  Bug bite on R leg  Earaches come and go.  Did not have a good experience at [facility], requests referral to new OBGYN. Persistent painful heavy periods (heavy bleed one day) treated with naprosyn. Known fibroid. Never had f/u US done.  No concern for STDs. Doesn't use condoms regularly. In relationship.  Preventative:  Well woman - last 2009. H/o abnormal in past. LMP last week of 01/2014. 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  Allergies and medications reviewed and updated. Current Outpatient Prescriptions on File Prior to Visit  Medication Sig  . ferrous sulfate 325 (65 FE) MG tablet Take 325 mg by mouth daily with breakfast.  . megestrol (MEGACE) 40 MG tablet Take 2 tablets (80 mg total) by mouth 2 (two) times daily.   No current facility-administered medications on file prior to visit.    Review of Systems  Constitutional: Negative for fever, chills, activity change, appetite change, fatigue and unexpected weight change.  HENT: Negative for hearing loss.   Eyes: Negative for visual disturbance.  Respiratory: Negative for cough, chest tightness,  shortness of breath and wheezing.   Cardiovascular: Positive for leg swelling. Negative for chest pain and palpitations.  Gastrointestinal: Negative for nausea, vomiting, abdominal pain, diarrhea, constipation, blood in stool and abdominal distention.  Genitourinary: Negative for hematuria and difficulty urinating.  Musculoskeletal: Negative for arthralgias, myalgias and neck pain.  Skin: Negative for rash.  Neurological: Positive for headaches. Negative for dizziness, seizures and syncope.  Hematological: Negative for adenopathy. Does not bruise/bleed easily.  Psychiatric/Behavioral: Negative for dysphoric mood. The patient is not nervous/anxious.    Per HPI unless specifically indicated above    Objective:    BP 126/84  Pulse 64  Temp(Src) 98.2 F (36.8 C) (Oral)  Ht 5\' 4"  (1.626 m)  Wt 270 lb (122.471 kg)  BMI 46.32 kg/m2  LMP 02/11/2014  Physical Exam  Nursing note and vitals reviewed. Constitutional: She is oriented to person, place, and time. She appears well-developed and well-nourished. No distress.  obese  HENT:  Head: Normocephalic and atraumatic.  Right Ear: Hearing, tympanic membrane, external ear and ear canal normal.  Left Ear: Hearing, tympanic membrane, external ear and ear canal normal.  Nose: Nose normal.  Mouth/Throat: Uvula is midline, oropharynx is clear and moist and mucous membranes are normal. No oropharyngeal exudate, posterior oropharyngeal edema or posterior oropharyngeal erythema.  Eyes: Conjunctivae and EOM are normal. Pupils are equal, round, and reactive to light. No scleral icterus.  Neck: Normal range of motion. Neck supple.  Cardiovascular: Normal rate,  regular rhythm, normal heart sounds and intact distal pulses.   No murmur heard. Pulses:      Radial pulses are 2+ on the right side, and 2+ on the left side.  Pulmonary/Chest: Effort normal and breath sounds normal. No respiratory distress. She has no wheezes. She has no rales. Right breast  exhibits no inverted nipple, no mass, no nipple discharge, no skin change and no tenderness. Left breast exhibits no inverted nipple, no mass, no nipple discharge, no skin change and no tenderness.  Abdominal: Soft. Bowel sounds are normal. She exhibits no distension and no mass. There is no tenderness. There is no rebound and no guarding.  Genitourinary: Vagina normal and uterus normal. Pelvic exam was performed with patient supine. There is no rash, tenderness, lesion or injury on the right labia. There is no rash, tenderness, lesion or injury on the left labia. Cervix exhibits no motion tenderness, no discharge and no friability. Right adnexum displays no mass, no tenderness and no fullness. Left adnexum displays no mass, no tenderness and no fullness.  Difficult exam  Musculoskeletal: Normal range of motion. She exhibits no edema.  Lymphadenopathy:       Head (right side): No submental, no submandibular, no tonsillar, no preauricular and no posterior auricular adenopathy present.       Head (left side): No submental, no submandibular, no tonsillar, no preauricular and no posterior auricular adenopathy present.    She has no cervical adenopathy.    She has no axillary adenopathy.       Right axillary: No lateral adenopathy present.       Left axillary: No lateral adenopathy present.      Right: No supraclavicular adenopathy present.       Left: No supraclavicular adenopathy present.  Neurological: She is alert and oriented to person, place, and time.  CN grossly intact, station and gait intact  Skin: Skin is warm and dry. No rash noted.  Psychiatric: She has a normal mood and affect. Her behavior is normal. Judgment and thought content normal.   Results for orders placed in visit on 01/07/14  LIPID PANEL      Result Value Ref Range   Cholesterol 114  0 - 200 mg/dL   Triglycerides 64.0  0.0 - 149.0 mg/dL   HDL 43.50  >39.00 mg/dL   VLDL 12.8  0.0 - 40.0 mg/dL   LDL Cholesterol 58  0 - 99  mg/dL   Total CHOL/HDL Ratio 3     NonHDL 70.50    COMPREHENSIVE METABOLIC PANEL      Result Value Ref Range   Sodium 137  135 - 145 mEq/L   Potassium 3.6  3.5 - 5.1 mEq/L   Chloride 108  96 - 112 mEq/L   CO2 24  19 - 32 mEq/L   Glucose, Bld 72  70 - 99 mg/dL   BUN 8  6 - 23 mg/dL   Creatinine, Ser 0.8  0.4 - 1.2 mg/dL   Total Bilirubin 0.6  0.2 - 1.2 mg/dL   Alkaline Phosphatase 66  39 - 117 U/L   AST 18  0 - 37 U/L   ALT 12  0 - 35 U/L   Total Protein 6.7  6.0 - 8.3 g/dL   Albumin 3.4 (*) 3.5 - 5.2 g/dL   Calcium 8.6  8.4 - 10.5 mg/dL   GFR 98.04  >60.00 mL/min  TSH      Result Value Ref Range   TSH 1.10  0.35 -  4.50 uIU/mL  CBC WITH DIFFERENTIAL      Result Value Ref Range   WBC 5.0  4.0 - 10.5 K/uL   RBC 4.28  3.87 - 5.11 Mil/uL   Hemoglobin 10.6 (*) 12.0 - 15.0 g/dL   HCT 32.8 (*) 36.0 - 46.0 %   MCV 76.7 (*) 78.0 - 100.0 fl   MCHC 32.3  30.0 - 36.0 g/dL   RDW 17.8 (*) 11.5 - 15.5 %   Platelets 252.0  150.0 - 400.0 K/uL   Neutrophils Relative % 59.0  43.0 - 77.0 %   Lymphocytes Relative 30.1  12.0 - 46.0 %   Monocytes Relative 8.6  3.0 - 12.0 %   Eosinophils Relative 1.9  0.0 - 5.0 %   Basophils Relative 0.4  0.0 - 3.0 %   Neutro Abs 3.0  1.4 - 7.7 K/uL   Lymphs Abs 1.5  0.7 - 4.0 K/uL   Monocytes Absolute 0.4  0.1 - 1.0 K/uL   Eosinophils Absolute 0.1  0.0 - 0.7 K/uL   Basophils Absolute 0.0  0.0 - 0.1 K/uL      Assessment & Plan:   Problem List Items Addressed This Visit   Uterine fibroid     Requests referral to new OBGYN - placed today. Continue naprosyn 500mg  prn painful periods.    Relevant Orders      Ambulatory referral to Gynecology   Morbid obesity     Encouraged continued daily walking    Menorrhagia   Relevant Orders      Ambulatory referral to Gynecology   HYPERTENSION     Chronic, stable. Continue regimen. Discussed condom use 100% as she's on lisinopril    Relevant Medications      amLODIpine (NORVASC) tablet       lisinopril-hydrochlorothiazide (ZESTORETIC) 20-12.5 MG per tablet   Health care maintenance - Primary     Preventative protocols reviewed and updated unless pt declined. Discussed healthy diet and lifestyle.    FIBROCYSTIC BREAST DISEASE     Stable clinical breast exam today    ANEMIA, IRON DEFICIENCY     Stable anemia - continue iron, f/u with OBGYN for further menorrhagia/fibroid treatment options        Follow up plan: Return in about 1 year (around 02/23/2015), or as needed, for physical.

## 2014-02-22 NOTE — Assessment & Plan Note (Signed)
Preventative protocols reviewed and updated unless pt declined. Discussed healthy diet and lifestyle.  

## 2014-02-22 NOTE — Assessment & Plan Note (Signed)
Stable clinical breast exam today

## 2014-02-22 NOTE — Patient Instructions (Addendum)
Continue iron tablet daily. Good to see you today, call us with quesitons. Return as needed or in 1 year for next physical. We will refer you to different OBGYN today.

## 2014-02-22 NOTE — Assessment & Plan Note (Signed)
Encouraged continued daily walking

## 2014-02-22 NOTE — Assessment & Plan Note (Signed)
Requests referral to new OBGYN - placed today. Continue naprosyn 500mg  prn painful periods.

## 2014-02-22 NOTE — Assessment & Plan Note (Addendum)
Chronic, stable. Continue regimen. Discussed condom use 100% as she's on lisinopril

## 2014-02-22 NOTE — Assessment & Plan Note (Addendum)
Stable anemia - continue iron, f/u with OBGYN for further menorrhagia/fibroid treatment options

## 2014-02-26 LAB — CYTOLOGY - PAP

## 2014-03-04 ENCOUNTER — Encounter: Payer: Self-pay | Admitting: *Deleted

## 2014-03-11 ENCOUNTER — Ambulatory Visit: Payer: Self-pay | Admitting: Family Medicine

## 2014-03-11 ENCOUNTER — Telehealth: Payer: Self-pay | Admitting: Family Medicine

## 2014-03-11 NOTE — Telephone Encounter (Signed)
Patient Information:  Caller Name: Cassie Mills  Phone: [phone]  Patient: Cassie Mills, Cassie Mills  Gender: Female  DOB: 1976-06-04  Age: 38 Years  PCP: Cassie Mills [facility])  Pregnant: No  Office Follow Up:  Does the office need to follow up with this patient?: No  Instructions For The Office: N/A  RN Note:  Worried about a possible blood clot at site of insect bite from July 2015.  Also noted lump behind left ear that was present at time of physical exam 01/22/14 but MD did not say anything about it.  Intially insect bite on right medial calf was itchy.  Later bruise developed with tender lump palpable under skin.  Advised to see MD today for red or very tender (to touch) area, getting larger over 48 hours after the bite per Insect Bite.  Symptoms  Reason For Call & Symptoms: Ongoing quarter-sized bruise with hard, painful, lump in medial right calf.  Lump is tender to touch; no drainage.  Origionally had an itchy insect bite in same location in July.  Reviewed Health History In EMR: Yes  Reviewed Medications In EMR: Yes  Reviewed Allergies In EMR: Yes  Reviewed Surgeries / Procedures: Yes  Date of Onset of Symptoms: 12/19/2013  Treatments Tried: hot compresses  Treatments Tried Worked: No OB / GYN:  LMP: 03/09/2014  Guideline(s) Used:  Insect Bite  Disposition Per Guideline:   See Today in Office  Reason For Disposition Reached:   Red or very tender (to touch) area, getting larger over 48 hours after the bite  Advice Given:  Pain Medicines:  For pain relief, you can take either acetaminophen, ibuprofen, or naproxen.  They are over-the-counter (OTC) pain drugs. You can buy them at the drugstore.  Expected Course:  Most insect bites are itchy and puffy for several days.  Insect bites of the upper face can cause marked swelling around the eye, but this is harmless.  Any pinkness or redness usually lasts 3 days.  Call Back If:  Severe pain lasts over 2 hours after  pain medicine  Bite looks infected (redness, red streaks, increased tenderness)  Redness getting larger and more than 48 hours after the bite  You become worse.  Patient Will Follow Care Advice:  YES  Appointment Scheduled:  03/11/2014 16:15:00 Appointment Scheduled Provider:  Ria Mills [facility])

## 2014-03-11 NOTE — Telephone Encounter (Signed)
Spoke to pt who states that she did not go to ED. Pt was scheduled with Dr Diona Browner @ [phone], but preferred to see PCP; sched with Dr Darnell Level @ [phone]

## 2014-03-11 NOTE — Telephone Encounter (Signed)
This does not sound like a blood clot - see prior CAN note. If has not gone to ER recommend schedule appt tomorrow with me.

## 2014-03-11 NOTE — Telephone Encounter (Signed)
[facility] nurse did also mention that pt stated she had this "lump" x 2 months

## 2014-03-11 NOTE — Telephone Encounter (Signed)
Patient Information:  Caller Name: Kendrick  Phone: [phone]  Patient: Cassie Mills, Cassie Mills  Gender: Female  DOB: 04-21-1976  Age: 38 Years  PCP: Ria Bush [facility])  Pregnant: No  Office Follow Up:  Does the office need to follow up with this patient?: No  Instructions For The Office: N/A  RN Note:  Per disposition contacted the office and spoke with Benjie Karvonen and advised pt to go to ED for eval.  Symptoms  Reason For Call & Symptoms: Pt reports she has lump in her right calf that looks blue and painful.  Reviewed Health History In EMR: Yes  Reviewed Medications In EMR: Yes  Reviewed Allergies In EMR: Yes  Reviewed Surgeries / Procedures: Yes  Date of Onset of Symptoms: 01/07/2014 OB / GYN:  LMP: Unknown  Guideline(s) Used:  Leg Pain  Disposition Per Guideline:   Go to ED Now (or to Office with PCP Approval)  Reason For Disposition Reached:   Thigh or calf pain in only one leg and present > 1 hour  Advice Given:  N/A  Patient Will Follow Care Advice:  YES

## 2014-03-12 ENCOUNTER — Ambulatory Visit: Payer: Self-pay | Admitting: Family Medicine

## 2014-03-12 ENCOUNTER — Ambulatory Visit (INDEPENDENT_AMBULATORY_CARE_PROVIDER_SITE_OTHER): Payer: Self-pay | Admitting: Family Medicine

## 2014-03-12 ENCOUNTER — Encounter: Payer: Self-pay | Admitting: Family Medicine

## 2014-03-12 VITALS — BP 126/88 | HR 68 | Temp 98.6°F | Wt 272.2 lb

## 2014-03-12 DIAGNOSIS — R590 Localized enlarged lymph nodes: Secondary | ICD-10-CM | POA: Insufficient documentation

## 2014-03-12 DIAGNOSIS — R229 Localized swelling, mass and lump, unspecified: Secondary | ICD-10-CM

## 2014-03-12 DIAGNOSIS — R599 Enlarged lymph nodes, unspecified: Secondary | ICD-10-CM

## 2014-03-12 MED ORDER — DICLOFENAC SODIUM 1 % TD GEL: 1.0000 "application " | Freq: Three times a day (TID) | TRANSDERMAL | Status: DC

## 2014-03-12 NOTE — Progress Notes (Signed)
BP 126/88  Pulse 68  Temp(Src) 98.6 F (37 C) (Oral)  Wt 272 lb 4 oz (123.492 kg)  LMP 02/09/2014   CC: check lump behind ear and calf  Subjective:    Patient ID: Cassie Mills, female    DOB: July 16, 1975, 38 y.o.   MRN: [id number]  HPI: Cassie Mills is a 38 y.o. female presenting on 03/12/2014 for lump behind left ear and lump right calf   Lump behind left ear - noticed for last several months. Some earache. Some pain under chin. Denies congestion, rhinorrhea, cough, drainage from ear, fevers/chills.  Did swim 1 month ago. Swelling comes and goes.  Lump R calf - present for last 2 months. Started with insect bite. Initially improving. Now has turned into darker lump on skin. Warm compresses did not help. Does have 4 hour car ride every other week to [place] to visit father. Not on HRT. Not smoker but exposed to 2nd hand smoke in [place].  Relevant past medical, surgical, family and social history reviewed and updated as indicated.  Allergies and medications reviewed and updated. Current Outpatient Prescriptions on File Prior to Visit  Medication Sig  . amLODipine (NORVASC) 5 MG tablet Take 1 tablet (5 mg total) by mouth daily.  . ferrous sulfate 325 (65 FE) MG tablet Take 325 mg by mouth daily with breakfast.  . lisinopril-hydrochlorothiazide (ZESTORETIC) 20-12.5 MG per tablet Take 1 tablet by mouth daily.  . naproxen (NAPROSYN) 500 MG tablet Take 1 tablet (500 mg total) by mouth 2 (two) times daily with a meal.   No current facility-administered medications on file prior to visit.    Review of Systems Per HPI unless specifically indicated above    Objective:    BP 126/88  Pulse 68  Temp(Src) 98.6 F (37 C) (Oral)  Wt 272 lb 4 oz (123.492 kg)  LMP 02/09/2014  Physical Exam  Nursing note and vitals reviewed. Constitutional: She appears well-developed and well-nourished. No distress.  HENT:  Head: Normocephalic and atraumatic.  Right Ear: Hearing,  tympanic membrane, external ear and ear canal normal.  Left Ear: Hearing, tympanic membrane, external ear and ear canal normal.  Nose: Nose normal.  Mouth/Throat: Uvula is midline, oropharynx is clear and moist and mucous membranes are normal. No oropharyngeal exudate, posterior oropharyngeal edema, posterior oropharyngeal erythema or tonsillar abscesses.  Musculoskeletal: She exhibits no edema.  Small hyperpigmented indurated nodule right medial calf that is tender to palpation  L calf circ 43cm  R calf circ 43cm No pedal edema  Lymphadenopathy:       Head (right side): No submental, no submandibular, no tonsillar, no preauricular, no posterior auricular and no occipital adenopathy present.       Head (left side): No submental, no submandibular, no tonsillar, no preauricular, no posterior auricular (small palpable LN nontender <1cm size) and no occipital adenopathy present.    She has no cervical adenopathy.       Right cervical: No superficial cervical adenopathy present.      Left cervical: No superficial cervical adenopathy present.       Right: No supraclavicular adenopathy present.       Left: No supraclavicular adenopathy present.       Assessment & Plan:   Problem List Items Addressed This Visit   Skin nodule - Primary     After presumed bug bite. Anticipate post inflammatory scarred nodule - treat with voltaren gel tid for next week. Less likely superficial thrombophlebitis but no evidence  of active infection today. voltaren gel should help with this as well. Not consistent with DVT today but consider soft tissue US if not improved? Pt very worried about this.    Lymphadenopathy, postauricular     Intermittent. Today not enlarged and not tender. HEENT exam also benign. Reassured. Advised monitor for congestion or earache when has enlargement of LN.        Follow up plan: No Follow-up on file.

## 2014-03-12 NOTE — Assessment & Plan Note (Signed)
After presumed bug bite. Anticipate post inflammatory scarred nodule - treat with voltaren gel tid for next week. Less likely superficial thrombophlebitis but no evidence of active infection today. voltaren gel should help with this as well. Not consistent with DVT today but consider soft tissue US if not improved? Pt very worried about this.

## 2014-03-12 NOTE — Progress Notes (Signed)
Pre visit review using our clinic review tool, if applicable. No additional management support is needed unless otherwise documented below in the visit note. 

## 2014-03-12 NOTE — Patient Instructions (Signed)
Let's try voltaren cream to right calf. Use on leg 2-3 times daily. Call me after 1 week of use with an update. For lump behind ear - I think this is a swollen lymph node, but today it feels ok. Let us know if worsening symptoms or swelling.

## 2014-03-12 NOTE — Assessment & Plan Note (Signed)
Intermittent. Today not enlarged and not tender. HEENT exam also benign. Reassured. Advised monitor for congestion or earache when has enlargement of LN.

## 2014-03-18 ENCOUNTER — Ambulatory Visit: Payer: Self-pay | Admitting: Obstetrics & Gynecology

## 2014-04-12 ENCOUNTER — Ambulatory Visit: Payer: Self-pay | Admitting: Obstetrics & Gynecology

## 2014-04-12 DIAGNOSIS — N920 Excessive and frequent menstruation with regular cycle: Secondary | ICD-10-CM

## 2014-06-28 ENCOUNTER — Telehealth: Payer: Self-pay | Admitting: *Deleted

## 2014-06-28 NOTE — Telephone Encounter (Signed)
Lm on pts vm informing her to contact office if wanting to schedule flu shot

## 2015-02-15 ENCOUNTER — Other Ambulatory Visit: Payer: Self-pay | Admitting: Family Medicine

## 2015-02-15 DIAGNOSIS — I10 Essential (primary) hypertension: Secondary | ICD-10-CM

## 2015-02-15 DIAGNOSIS — R7303 Prediabetes: Secondary | ICD-10-CM

## 2015-02-15 DIAGNOSIS — R202 Paresthesia of skin: Secondary | ICD-10-CM

## 2015-02-15 DIAGNOSIS — D509 Iron deficiency anemia, unspecified: Secondary | ICD-10-CM

## 2015-02-18 ENCOUNTER — Other Ambulatory Visit: Payer: Self-pay

## 2015-02-25 ENCOUNTER — Encounter: Payer: Self-pay | Admitting: Family Medicine

## 2015-03-06 ENCOUNTER — Telehealth: Payer: Self-pay | Admitting: Family Medicine

## 2015-03-06 NOTE — Telephone Encounter (Signed)
Pt brought in ppw regarding no work restrictions for a job she is trying to get. Best number to reach her is [phone]. Placing ppw on Kim's desk.

## 2015-03-06 NOTE — Telephone Encounter (Signed)
Message left for patient to return my call. Will need physical to complete form. Has not been seen in 1 year.

## 2015-03-07 NOTE — Telephone Encounter (Signed)
Ok. Unfortunately will not be able to complete form.

## 2015-03-07 NOTE — Telephone Encounter (Signed)
Pt returned your call.  i read her your phone note.  Pt stated she didn't have health care and would not schedule appointment

## 2015-07-25 ENCOUNTER — Other Ambulatory Visit: Payer: Self-pay | Admitting: Family Medicine

## 2015-10-06 DIAGNOSIS — N915 Oligomenorrhea, unspecified: Secondary | ICD-10-CM | POA: Insufficient documentation

## 2015-11-26 DIAGNOSIS — D259 Leiomyoma of uterus, unspecified: Secondary | ICD-10-CM | POA: Insufficient documentation

## 2015-12-26 DIAGNOSIS — R079 Chest pain, unspecified: Secondary | ICD-10-CM | POA: Insufficient documentation

## 2016-01-15 ENCOUNTER — Other Ambulatory Visit: Payer: Self-pay | Admitting: Family Medicine

## 2016-04-20 DIAGNOSIS — F329 Major depressive disorder, single episode, unspecified: Secondary | ICD-10-CM | POA: Insufficient documentation

## 2016-04-20 DIAGNOSIS — F32A Depression, unspecified: Secondary | ICD-10-CM | POA: Insufficient documentation

## 2017-04-24 ENCOUNTER — Ambulatory Visit (HOSPITAL_COMMUNITY)
Admission: RE | Admit: 2017-04-24 | Discharge: 2017-04-24 | Disposition: A | Discharge status: Home / Self Care | Payer: Self-pay | Attending: Psychiatry | Admitting: Psychiatry

## 2017-04-24 ENCOUNTER — Encounter (HOSPITAL_COMMUNITY): Payer: Self-pay | Admitting: Behavioral Health

## 2017-04-24 NOTE — H&P (Signed)
Behavioral Health Medical Screening Exam  Cassie Mills is an 41 y.o. female who arrived voluntarily with c/o depression over her housing condition. She stated that she does not like where she currently reside with her mother, brother and 66 of brother's children. Patient denies any SI/HI/VAH. She requesting for outpatient resources and she is willing to follow up.  Total Time spent with patient: 30 minutes   Psychiatric Specialty Exam: Physical Exam  Constitutional: She is oriented to person, place, and time. She appears well-developed and well-nourished.  HENT:  Head: Normocephalic and atraumatic.  Neck: Normal range of motion. Neck supple.  Cardiovascular:  154/96; was diagnosed with HBP but haven't taken meds in two months because of no insurance.   Respiratory: Effort normal and breath sounds normal.  GI: Soft. Bowel sounds are normal.  Musculoskeletal: Normal range of motion.  Neurological: She is alert and oriented to person, place, and time.  Skin: Skin is warm and dry.  Psychiatric: Her speech is normal and behavior is normal. Judgment and thought content normal. Cognition and memory are normal. She exhibits a depressed mood.    Review of Systems  Psychiatric/Behavioral: Positive for depression. Negative for hallucinations, substance abuse and suicidal ideas. The patient is not nervous/anxious and does not have insomnia.   All other systems reviewed and are negative.   Blood pressure (!) 154/96, pulse 67, temperature 98.2 F (36.8 C), temperature source Oral, resp. rate 18, SpO2 100 %.There is no height or weight on file to calculate BMI.  General Appearance: Casual and Well Groomed  Eye Contact:  Good  Speech:  Clear and Coherent and Normal Rate  Volume:  Normal  Mood:  Euthymic  Affect:  Congruent  Thought Process:  Coherent and Goal Directed  Orientation:  Full (Time, Place, and Person)  Thought Content:  WDL and Logical  Suicidal Thoughts:  No  Homicidal Thoughts:   No  Memory:  Immediate;   Good Recent;   Good Remote;   Fair  Judgement:  Good  Insight:  Good  Psychomotor Activity:  Normal  Concentration: Concentration: Good and Attention Span: Good  Recall:  Good  Fund of Knowledge:Good  Language: Good  Akathisia:  Negative  Handed:  Right  AIMS (if indicated):     Assets:  Communication Skills Desire for Improvement Financial Resources/Insurance Physical Health Resilience Social Support  Sleep:       Musculoskeletal: Strength & Muscle Tone: within normal limits Gait & Station: normal Patient leans: N/A  Blood pressure (!) 154/96, pulse 67, temperature 98.2 F (36.8 C), temperature source Oral, resp. rate 18, SpO2 100 %.  Recommendations:  Based on my evaluation the patient does not appear to have an emergency medical condition.  Vicenta Aly, NP 04/24/2017, 12:35 PM
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
## 2017-09-26 ENCOUNTER — Ambulatory Visit: Payer: Self-pay | Admitting: Family Medicine

## 2018-08-22 ENCOUNTER — Ambulatory Visit: Payer: Self-pay | Admitting: Registered Nurse

## 2018-08-22 ENCOUNTER — Encounter: Payer: Self-pay | Admitting: Registered Nurse

## 2018-08-22 VITALS — BP 200/132 | HR 69

## 2018-08-22 DIAGNOSIS — R202 Paresthesia of skin: Secondary | ICD-10-CM

## 2018-08-22 DIAGNOSIS — I10 Essential (primary) hypertension: Secondary | ICD-10-CM

## 2018-08-22 NOTE — Patient Instructions (Signed)
Obesity, Adult Obesity is the condition of having too much total body fat. Being overweight or obese means that your weight is greater than what is considered healthy for your body size. Obesity is determined by a measurement called BMI. BMI is an estimate of body fat and is calculated from height and weight. For adults, a BMI of 30 or higher is considered obese. Obesity can eventually lead to other health concerns and major illnesses, including:  Stroke.  Coronary artery disease (CAD).  Type 2 diabetes.  Some types of cancer, including cancers of the colon, breast, uterus, and gallbladder.  Osteoarthritis.  High blood pressure (hypertension).  High cholesterol.  Sleep apnea.  Gallbladder stones.  Infertility problems. What are the causes? The main cause of obesity is taking in (consuming) more calories than your body uses for energy. Other factors that contribute to this condition may include:  Being born with genes that make you more likely to become obese.  Having a medical condition that causes obesity. These conditions include: ? Hypothyroidism. ? Polycystic ovarian syndrome (PCOS). ? Binge-eating disorder. ? Cushing syndrome.  Taking certain medicines, such as steroids, antidepressants, and seizure medicines.  Not being physically active (sedentary lifestyle).  Living where there are limited places to exercise safely or buy healthy foods.  Not getting enough sleep. What increases the risk? The following factors may increase your risk of this condition:  Having a family history of obesity.  Being a woman of African-American descent.  Being a man of Hispanic descent. What are the signs or symptoms? Having excessive body fat is the main symptom of this condition. How is this diagnosed? This condition may be diagnosed based on:  Your symptoms.  Your medical history.  A physical exam. Your health care provider may measure: ? Your BMI. If you are an adult  with a BMI between 25 and less than 30, you are considered overweight. If you are an adult with a BMI of 30 or higher, you are considered obese. ? The distances around your hips and your waist (circumferences). These may be compared to each other to help diagnose your condition. ? Your skinfold thickness. Your health care provider may gently pinch a fold of your skin and measure it. How is this treated? Treatment for this condition often includes changing your lifestyle. Treatment may include some or all of the following:  Dietary changes. Work with your health care provider and a dietitian to set a weight-loss goal that is healthy and reasonable for you. Dietary changes may include eating: ? Smaller portions. A portion size is the amount of a particular food that is healthy for you to eat at one time. This varies from person to person. ? Low-calorie or low-fat options. ? More whole grains, fruits, and vegetables.  Regular physical activity. This may include aerobic activity (cardio) and strength training.  Medicine to help you lose weight. Your health care provider may prescribe medicine if you are unable to lose 1 pound a week after 6 weeks of eating more healthily and doing more physical activity.  Surgery. Surgical options may include gastric banding and gastric bypass. Surgery may be done if: ? Other treatments have not helped to improve your condition. ? You have a BMI of 40 or higher. ? You have life-threatening health problems related to obesity. Follow these instructions at home:  Eating and drinking   Follow recommendations from your health care provider about what you eat and drink. Your health care provider may advise you  to: ? Limit fast foods, sweets, and processed snack foods. ? Choose low-fat options, such as low-fat milk instead of whole milk. ? Eat 5 or more servings of fruits or vegetables every day. ? Eat at home more often. This gives you more control over what you  eat. ? Choose healthy foods when you eat out. ? Learn what a healthy portion size is. ? Keep low-fat snacks on hand. ? Avoid sugary drinks, such as soda, fruit juice, iced tea sweetened with sugar, and flavored milk. ? Eat a healthy breakfast.  Drink enough water to keep your urine clear or pale yellow.  Do not go without eating for long periods of time (do not fast) or follow a fad diet. Fasting and fad diets can be unhealthy and even dangerous. Physical Activity  Exercise regularly, as told by your health care provider. Ask your health care provider what types of exercise are safe for you and how often you should exercise.  Warm up and stretch before being active.  Cool down and stretch after being active.  Rest between periods of activity. Lifestyle  Limit the time that you spend in front of your TV, computer, or video game system.  Find ways to reward yourself that do not involve food.  Limit alcohol intake to no more than 1 drink a day for nonpregnant women and 2 drinks a day for men. One drink equals 12 oz of beer, 5 oz of wine, or 1 oz of hard liquor. General instructions  Keep a weight loss journal to keep track of the food you eat and how much you exercise you get.  Take over-the-counter and prescription medicines only as told by your health care provider.  Take vitamins and supplements only as told by your health care provider.  Consider joining a support group. Your health care provider may be able to recommend a support group.  Keep all follow-up visits as told by your health care provider. This is important. Contact a health care provider if:  You are unable to meet your weight loss goal after 6 weeks of dietary and lifestyle changes. This information is not intended to replace advice given to you by your health care provider. Make sure you discuss any questions you have with your health care provider. Document Released: 07/15/2004 Document Revised: 11/10/2015  Document Reviewed: 03/26/2015 Elsevier Interactive Patient Education  2019 [employer]. Managing Your Hypertension Hypertension is commonly called high blood pressure. This is when the force of your blood pressing against the walls of your arteries is too strong. Arteries are blood vessels that carry blood from your heart throughout your body. Hypertension forces the heart to work harder to pump blood, and may cause the arteries to become narrow or stiff. Having untreated or uncontrolled hypertension can cause heart attack, stroke, kidney disease, and other problems. What are blood pressure readings? A blood pressure reading consists of a higher number over a lower number. Ideally, your blood pressure should be below 120/80. The first ("top") number is called the systolic pressure. It is a measure of the pressure in your arteries as your heart beats. The second ("bottom") number is called the diastolic pressure. It is a measure of the pressure in your arteries as the heart relaxes. What does my blood pressure reading mean? Blood pressure is classified into four stages. Based on your blood pressure reading, your health care provider may use the following stages to determine what type of treatment you need, if any. Systolic pressure and  diastolic pressure are measured in a unit called mm Hg. Normal  Systolic pressure: below 382.  Diastolic pressure: below 80. Elevated  Systolic pressure: 505-397.  Diastolic pressure: below 80. Hypertension stage 1  Systolic pressure: 673-419.  Diastolic pressure: 37-90. Hypertension stage 2  Systolic pressure: 240 or above.  Diastolic pressure: 90 or above. What health risks are associated with hypertension? Managing your hypertension is an important responsibility. Uncontrolled hypertension can lead to:  A heart attack.  A stroke.  A weakened blood vessel (aneurysm).  Heart failure.  Kidney damage.  Eye damage.  Metabolic  syndrome.  Memory and concentration problems. What changes can I make to manage my hypertension? Hypertension can be managed by making lifestyle changes and possibly by taking medicines. Your health care provider will help you make a plan to bring your blood pressure within a normal range. Eating and drinking   Eat a diet that is high in fiber and potassium, and low in salt (sodium), added sugar, and fat. An example eating plan is called the DASH (Dietary Approaches to Stop Hypertension) diet. To eat this way: ? Eat plenty of fresh fruits and vegetables. Try to fill half of your plate at each meal with fruits and vegetables. ? Eat whole grains, such as whole wheat pasta, brown rice, or whole grain bread. Fill about one quarter of your plate with whole grains. ? Eat low-fat diary products. ? Avoid fatty cuts of meat, processed or cured meats, and poultry with skin. Fill about one quarter of your plate with lean proteins such as fish, chicken without skin, beans, eggs, and tofu. ? Avoid premade and processed foods. These tend to be higher in sodium, added sugar, and fat.  Reduce your daily sodium intake. Most people with hypertension should eat less than 1,500 mg of sodium a day.  Limit alcohol intake to no more than 1 drink a day for nonpregnant women and 2 drinks a day for men. One drink equals 12 oz of beer, 5 oz of wine, or 1 oz of hard liquor. Lifestyle  Work with your health care provider to maintain a healthy body weight, or to lose weight. Ask what an ideal weight is for you.  Get at least 30 minutes of exercise that causes your heart to beat faster (aerobic exercise) most days of the week. Activities may include walking, swimming, or biking.  Include exercise to strengthen your muscles (resistance exercise), such as weight lifting, as part of your weekly exercise routine. Try to do these types of exercises for 30 minutes at least 3 days a week.  Do not use any products that contain  nicotine or tobacco, such as cigarettes and e-cigarettes. If you need help quitting, ask your health care provider.  Control any long-term (chronic) conditions you have, such as high cholesterol or diabetes. Monitoring  Monitor your blood pressure at home as told by your health care provider. Your personal target blood pressure may vary depending on your medical conditions, your age, and other factors.  Have your blood pressure checked regularly, as often as told by your health care provider. Working with your health care provider  Review all the medicines you take with your health care provider because there may be side effects or interactions.  Talk with your health care provider about your diet, exercise habits, and other lifestyle factors that may be contributing to hypertension.  Visit your health care provider regularly. Your health care provider can help you create and adjust your plan for  managing hypertension. Will I need medicine to control my blood pressure? Your health care provider may prescribe medicine if lifestyle changes are not enough to get your blood pressure under control, and if:  Your systolic blood pressure is 130 or higher.  Your diastolic blood pressure is 80 or higher. Take medicines only as told by your health care provider. Follow the directions carefully. Blood pressure medicines must be taken as prescribed. The medicine does not work as well when you skip doses. Skipping doses also puts you at risk for problems. Contact a health care provider if:  You think you are having a reaction to medicines you have taken.  You have repeated (recurrent) headaches.  You feel dizzy.  You have swelling in your ankles.  You have trouble with your vision. Get help right away if:  You develop a severe headache or confusion.  You have unusual weakness or numbness, or you feel faint.  You have severe pain in your chest or abdomen.  You vomit repeatedly.  You have  trouble breathing. Summary  Hypertension is when the force of blood pumping through your arteries is too strong. If this condition is not controlled, it may put you at risk for serious complications.  Your personal target blood pressure may vary depending on your medical conditions, your age, and other factors. For most people, a normal blood pressure is less than 120/80.  Hypertension is managed by lifestyle changes, medicines, or both. Lifestyle changes include weight loss, eating a healthy, low-sodium diet, exercising more, and limiting alcohol. This information is not intended to replace advice given to you by your health care provider. Make sure you discuss any questions you have with your health care provider. Document Released: 03/01/2012 Document Revised: 05/05/2016 Document Reviewed: 05/05/2016 Elsevier Interactive Patient Education  2019 [place]. Trigger Finger  Trigger finger (stenosing tenosynovitis) is a condition that causes a finger to get stuck in a bent position. Each finger has a tough, cord-like tissue that connects muscle to bone (tendon), and each tendon is surrounded by a tunnel of tissue (tendon sheath). To move your finger, your tendon needs to slide freely through the sheath. Trigger finger happens when the tendon or the sheath thickens, making it difficult to move your finger. Trigger finger can affect any finger or a thumb. It may affect more than one finger. Mild cases may clear up with rest and medicine. Severe cases require more treatment. What are the causes? Trigger finger is caused by a thickened finger tendon or tendon sheath. The cause of this thickening is not known. What increases the risk? The following factors may make you more likely to develop this condition:  Doing activities that require a strong grip.  Having rheumatoid arthritis, gout, or diabetes.  Being 78-43 years old.  Being a woman. What are the signs or symptoms? Symptoms of this  condition include:  Pain when bending or straightening your finger.  Tenderness or swelling where your finger attaches to the palm of your hand.  A lump in the palm of your hand or on the inside of your finger.  Hearing a popping sound when you try to straighten your finger.  Feeling a popping, catching, or locking sensation when you try to straighten your finger.  Being unable to straighten your finger. How is this diagnosed? This condition is diagnosed based on your symptoms and a physical exam. How is this treated? This condition may be treated by:  Resting your finger and avoiding activities that make  symptoms worse.  Wearing a finger splint to keep your finger in a slightly bent position.  Taking NSAIDs to relieve pain and swelling.  Injecting medicine (steroids) into the tendon sheath to reduce swelling and irritation. Injections may need to be repeated.  Having surgery to open the tendon sheath. This may be done if other treatments do not work and you cannot straighten your finger. You may need physical therapy after surgery. Follow these instructions at home:   Use moist heat to help reduce pain and swelling as told by your health care provider.  Rest your finger and avoid activities that make pain worse. Return to normal activities as told by your health care provider.  If you have a splint, wear it as told by your health care provider.  Take over-the-counter and prescription medicines only as told by your health care provider.  Keep all follow-up visits as told by your health care provider. This is important. Contact a health care provider if:  Your symptoms are not improving with home care. Summary  Trigger finger (stenosing tenosynovitis) causes your finger to get stuck in a bent position, and it can make it difficult and painful to straighten your finger.  This condition develops when a finger tendon or tendon sheath thickens.  Treatment starts with  resting, wearing a splint, and taking NSAIDs.  In severe cases, surgery to open the tendon sheath may be needed. This information is not intended to replace advice given to you by your health care provider. Make sure you discuss any questions you have with your health care provider. Document Released: 03/27/2004 Document Revised: 05/18/2016 Document Reviewed: 05/18/2016 Elsevier Interactive Patient Education  2019 [employer] Paresthesia Paresthesia is an abnormal burning or prickling sensation. It is usually felt in the hands, arms, legs, or feet. However, it may occur in any part of the body. Usually, paresthesia is not painful. It may feel like:  Tingling or numbness.  Pins and needles.  Skin crawling.  Buzzing.  Arms or legs falling asleep.  Itching. Paresthesia may occur without any clear cause, or it may be caused by:  Breathing too quickly (hyperventilation).  Pressure on a nerve.  An underlying medical condition.  Side effects of a medication.  Nutritional deficiencies.  Exposure to toxic chemicals. Most people experience temporary (transient) paresthesia at some time in their lives. For some people, it may be long-lasting (chronic) because of an underlying medical condition. If you have paresthesia that lasts a long time, you may need to be evaluated by your health care provider. Follow these instructions at home: Alcohol use   Do not drink alcohol if: ? Your health care provider tells you not to drink. ? You are pregnant, may be pregnant, or are planning to become pregnant.  If you drink alcohol, limit how much you have: ? 0-1 drink a day for women. ? 0-2 drinks a day for men.  Be aware of how much alcohol is in your drink. In the [place] one drink equals one typical bottle of beer (12 oz), one-half glass of wine (5 oz), or one shot of hard liquor (1 oz). Nutrition  Eat a healthy diet. This includes: ? Eating foods that are high in fiber, such as fresh  fruits and vegetables, whole grains, and beans. ? Limiting foods that are high in fat and processed sugars, such as fried or sweet foods. General instructions  Take over-the-counter and prescription medicines only as told by your health care provider.  Do not use  any products that contain nicotine or tobacco, such as cigarettes and e-cigarettes. These can keep blood from reaching damaged nerves. If you need help quitting, ask your health care provider.  If you have diabetes, work closely with your health care provider to keep your blood sugar under control.  If you have numbness in your feet: ? Check every day for signs of injury or infection. Watch for redness, warmth, and swelling. ? Wear padded socks and comfortable shoes. These help protect your feet.  Keep all follow-up visits as told by your health care provider. This is important. Contact a health care provider if you:  Have paresthesia that gets worse or does not go away.  Have a burning or prickling feeling that gets worse when you walk.  Have pain, cramps, or dizziness.  Develop a rash. Get help right away if you:  Feel weak.  Have trouble walking or moving.  Have problems with speech, understanding, or vision.  Feel confused.  Cannot control your bladder or bowel movements.  Have numbness after an injury.  Develop new weakness in an arm or leg.  Faint. Summary  Paresthesia is an abnormal burning or prickling sensation that is usually felt in the hands, arms, legs, or feet. It may also occur in other parts of the body.  Paresthesia may occur without any clear cause, or it may be caused by breathing too quickly (hyperventilation), pressure on a nerve, an underlying medical condition, side effects of a medication, nutritional deficiencies, or exposure to toxic chemicals.  If you have paresthesia that lasts a long time, you may need to be evaluated by your health care provider. This information is not intended  to replace advice given to you by your health care provider. Make sure you discuss any questions you have with your health care provider. Document Released: 05/28/2002 Document Revised: 06/16/2017 Document Reviewed: 06/16/2017 Elsevier Interactive Patient Education  2019 [place]  Carpal tunnel syndrome is a condition that causes pain in your hand and arm. The carpal tunnel is a narrow area that is on the palm side of your wrist. Repeated wrist motion or certain diseases may cause swelling in the tunnel. This swelling can pinch the main nerve in the wrist (median nerve). What are the causes? This condition may be caused by:  Repeated wrist motions.  Wrist injuries.  Arthritis.  A sac of fluid (cyst) or abnormal growth (tumor) in the carpal tunnel.  Fluid buildup during pregnancy. Sometimes the cause is not known. What increases the risk? The following factors may make you more likely to develop this condition:  Having a job in which you move your wrist in the same way many times. This includes jobs like being a Software engineer or a Scientist, water quality.  Being a woman.  Having other health conditions, such as: ? Diabetes. ? Obesity. ? A thyroid gland that is not active enough (hypothyroidism). ? Kidney failure. What are the signs or symptoms? Symptoms of this condition include:  A tingling feeling in your fingers.  Tingling or a loss of feeling (numbness) in your hand.  Pain in your entire arm. This pain may get worse when you bend your wrist and elbow for a long time.  Pain in your wrist that goes up your arm to your shoulder.  Pain that goes down into your palm or fingers.  A weak feeling in your hands. You may find it hard to grab and hold items. You may feel worse at night. How is  this diagnosed? This condition is diagnosed with a medical history and physical exam. You may also have tests, such as:  Electromyogram (EMG). This test checks the signals that  the nerves send to the muscles.  Nerve conduction study. This test checks how well signals pass through your nerves.  Imaging tests, such as X-rays, ultrasound, and MRI. These tests check for what might be the cause of your condition. How is this treated? This condition may be treated with:  Lifestyle changes. You will be asked to stop or change the activity that caused your problem.  Doing exercise and activities that make bones and muscles stronger (physical therapy).  Learning how to use your hand again (occupational therapy).  Medicines for pain and swelling (inflammation). You may have injections in your wrist.  A wrist splint.  Surgery. Follow these instructions at home: If you have a splint:  Wear the splint as told by your doctor. Remove it only as told by your doctor.  Loosen the splint if your fingers: ? Tingle. ? Lose feeling (become numb). ? Turn cold and blue.  Keep the splint clean.  If the splint is not waterproof: ? Do not let it get wet. ? Cover it with a watertight covering when you take a bath or a shower. Managing pain, stiffness, and swelling   If told, put ice on the painful area: ? If you have a removable splint, remove it as told by your doctor. ? Put ice in a plastic bag. ? Place a towel between your skin and the bag. ? Leave the ice on for 20 minutes, 2-3 times per day. General instructions  Take over-the-counter and prescription medicines only as told by your doctor.  Rest your wrist from any activity that may cause pain. If needed, talk with your boss at work about changes that can help your wrist heal.  Do any exercises as told by your doctor, physical therapist, or occupational therapist.  Keep all follow-up visits as told by your doctor. This is important. Contact a doctor if:  You have new symptoms.  Medicine does not help your pain.  Your symptoms get worse. Get help right away if:  You have very bad numbness or tingling in  your wrist or hand. Summary  Carpal tunnel syndrome is a condition that causes pain in your hand and arm.  It is often caused by repeated wrist motions.  Lifestyle changes and medicines are used to treat this problem. Surgery may help in very bad cases.  Follow your doctor's instructions about wearing a splint, resting your wrist, keeping follow-up visits, and calling for help. This information is not intended to replace advice given to you by your health care provider. Make sure you discuss any questions you have with your health care provider. Document Released: 05/27/2011 Document Revised: 10/14/2017 Document Reviewed: 10/14/2017 Elsevier Interactive Patient Education  2019 [employer].

## 2018-08-22 NOTE — Progress Notes (Signed)
Subjective:    Patient ID: Cassie Mills, female    DOB: [birth year], 43 y.o.   MRN: [id number]  42y/o African-American pt, new to clinic/new [place], reports Hx carpal tunnel syndrome pain, bilateral wrists over past 2 years. Has previously received an injection for this that helped a lot but since December pain began flaring again. Started in L wrist, then moved to L hand and fingers, R hand and fingers, with R>L now. +numbness and tingling to fingers.  Also with trigger finger R 4th finger, unable to straighten finger completely.   Pt requesting BP check as well. BP found to be severely elevated by RN Cheri Guppy  Patient reports Hx HTN. Has been on Lisinopril, Amlodipine in the past, but reports currently only taking HCTZ 12.5mg . Hasn't taken it in at least 2 days stating her new work schedule has her on a new routine and she keeps forgetting to take it. She denies HA, blurred vision, ShOB, diaphoresis. States she can usually tell when her BP is high because she will have heart palpitations. States she has been feeling these off and on over the past few days, every few hours. Usually last a couple minutes each time. Denies chest pressure or pain. Unchanged from her usual BP-related palpitations.   States sometimes with feet/ankle swelling end of day but none today currently.    [facility]. Last seen May 2019. Labs completed then there. No electronic records found that can be linked from that clinic. No pharmacy records found either except from 2015 and 2017 as she has med filled at their onsite pharmacy. Only other med currently is ferrous sulfate occasionally when she remembers to take it.  Pt advised by NP Betancourt to return for BP check in 1.5-2 hours (2:00-2:30) with RN Hildred Alamin after taking HCTZ 12.5mg  dispensed by NP from [facility] as pt does not have home med with her. Pt verbalizes understanding and agreement. If still  elevated but improving at that time, will have pt take an additional 12.5mg  tablet.      Review of Systems  Constitutional: Negative for activity change, appetite change, chills, diaphoresis, fatigue and fever.  HENT: Negative for trouble swallowing and voice change.   Eyes: Negative for photophobia and visual disturbance.  Respiratory: Negative for cough, shortness of breath, wheezing and stridor.   Cardiovascular: Positive for palpitations. Negative for chest pain and leg swelling.  Gastrointestinal: Negative for diarrhea, nausea and vomiting.  Endocrine: Negative for cold intolerance and heat intolerance.  Genitourinary: Negative for difficulty urinating.  Musculoskeletal: Positive for joint swelling and myalgias. Negative for back pain, gait problem, neck pain and neck stiffness.  Skin: Negative for color change, pallor, rash and wound.  Allergic/Immunologic: Negative for food allergies.  Neurological: Positive for numbness. Negative for dizziness, tremors, seizures, syncope, facial asymmetry, speech difficulty, weakness, light-headedness and headaches.  Psychiatric/Behavioral: Negative for agitation, confusion and sleep disturbance.       Objective:   Physical Exam Constitutional:      General: She is not in acute distress.    Appearance: Normal appearance. She is well-developed. She is obese. She is not ill-appearing, toxic-appearing or diaphoretic.  HENT:     Head: Normocephalic and atraumatic.     Right Ear: External ear normal.     Left Ear: External ear normal.     Nose: Nose normal. No congestion or rhinorrhea.     Mouth/Throat:     Mouth: Mucous membranes are  moist.     Pharynx: No oropharyngeal exudate or posterior oropharyngeal erythema.  Eyes:     General: Lids are normal. No visual field deficit.       Right eye: No discharge.        Left eye: No discharge.     Extraocular Movements: Extraocular movements intact.     Conjunctiva/sclera: Conjunctivae normal.      Pupils: Pupils are equal, round, and reactive to light.  Neck:     Musculoskeletal: Normal range of motion and neck supple. No neck rigidity or muscular tenderness.  Cardiovascular:     Rate and Rhythm: Normal rate and regular rhythm.     Pulses: Normal pulses.     Heart sounds: Normal heart sounds.  Pulmonary:     Effort: Pulmonary effort is normal. No respiratory distress.     Breath sounds: Normal breath sounds. No stridor. No wheezing, rhonchi or rales.  Chest:     Chest wall: No tenderness.  Abdominal:     Palpations: Abdomen is soft.  Musculoskeletal:        General: Tenderness and deformity present. No swelling.     Right shoulder: Normal.     Left shoulder: Normal.     Right elbow: Normal.    Left elbow: Normal.     Right wrist: She exhibits no bony tenderness, no swelling, no effusion, no crepitus, no deformity and no laceration.     Left wrist: She exhibits no swelling, no effusion, no crepitus, no deformity and no laceration.     Right hip: Normal.     Left hip: Normal.     Right knee: Normal.     Left knee: Normal.     Right ankle: Normal.     Left ankle: Normal.     Cervical back: Normal.     Thoracic back: Normal.     Lumbar back: Normal.     Right forearm: Normal.     Left forearm: Normal.     Right hand: She exhibits decreased range of motion. She exhibits no bony tenderness, normal two-point discrimination, normal capillary refill and no laceration.     Left hand: Normal.     Right lower leg: No edema.     Left lower leg: No edema.     Comments: Right 4th PIP does not fully extend AROM decreased 15 degrees from full extension/straight  Lymphadenopathy:     Head:     Right side of head: No submental, submandibular, tonsillar, preauricular, posterior auricular or occipital adenopathy.     Left side of head: No submental, submandibular, tonsillar, preauricular, posterior auricular or occipital adenopathy.     Cervical: No cervical adenopathy.     Right  cervical: No superficial cervical adenopathy.    Left cervical: No superficial cervical adenopathy.  Skin:    General: Skin is warm and dry.     Capillary Refill: Capillary refill takes less than 2 seconds.     Coloration: Skin is not ashen, cyanotic, jaundiced, mottled, pale or sallow.     Findings: No abrasion, abscess, acne, bruising, burn, ecchymosis, erythema, laceration, lesion, petechiae, rash or wound.     Nails: There is no clubbing.   Neurological:     General: No focal deficit present.     Mental Status: She is alert and oriented to person, place, and time. Mental status is at baseline.     GCS: GCS eye subscore is 4. GCS verbal subscore is 5. GCS motor subscore is 6.  Cranial Nerves: Cranial nerves are intact. No cranial nerve deficit, dysarthria or facial asymmetry.     Sensory: Sensation is intact. No sensory deficit.     Motor: Motor function is intact. No weakness, tremor, atrophy, abnormal muscle tone or seizure activity.     Coordination: Coordination is intact. Coordination normal.     Gait: Gait is intact. Gait normal.     Comments: Bilateral hand grasp equal 5/5; in/out of chair without difficulty; gait sure and steady in hallway  Psychiatric:        Attention and Perception: Attention and perception normal.        Mood and Affect: Mood and affect normal.        Speech: Speech normal.        Behavior: Behavior normal. Behavior is cooperative.        Thought Content: Thought content normal.        Cognition and Memory: Cognition and memory normal.        Judgment: Judgment normal.           Assessment & Plan:  A-hypertension, obesity, right finger 4th trigger, paresthesias hands bilateral  P-Dispensed hydrochlorothiazide 12.5mg  po daily #30 RF0 from [facility] to patient to take now in clinic.  Follow up in 1-1.5 hours for repeat blood pressure with RN Hildred Alamin.  If still elevated greater than 160/100 patient to take another 12.5mg  hydrochlorothiazide po x 1.  Noted  in Epic patient previously on amlodipine and lisinopril.  Current PCM does not utilize epic for EMR.  Patient has new employee labs scheduled fasting Friday 6 Mar.  Last labs May 2019 at [facility] per patient.  ER precautions discussed with patient--denied all at this time.  History of palpitations in the past 48 hours but currently none when patient in clinic.  Decrease caffeine intake.  Take her blood pressure medications at same time daily.  Patient to keep this new bottle of hydrochlorothiazide in her purse or at work in case she forgets to take her medication at home she can take when she remembers at work.  Discussed ER if chest pain, worst headache of life, dyspnea or visual changes for re-evaluation.  Will see RN Hildred Alamin this week for repeat BP check after today's repeat.  Exitcare handout hypertension and obesity.  Will discuss exercise and weight loss at follow visit Thursday/48 hours.  Patient verbalized understanding information/instructions, agreed with plan of care and had no further questions at this time.  RN Hildred Alamin fitted and distributed wrist splint right large to patient discussed with her to wear at work and while sleeping to see if any change in her finger symptoms.  Discussed symptoms of carpal tunnel syndrome with patient.  Patient is new employee at [employer]--changes in her routine and repetitive movements with [place] pulling/evaluation.  Consider tylenol 1000mg  po QID prn pain. Cryotherapy 15 minutes TID, elevation bilateral hands when on break if swelling noted.  May remove splints to shower and perform gentle arom QID.  Patient will follow up Thursday for re-evaluation of BP and trigger finger and finger tingling/numbness/pain.  Consider finger splint application Thursday.  Patient was fitted for wrist splint today by RN Hildred Alamin and distributed from clinic stock size large right.  Tylenol 1000mg  po q6h prn.  Avoiding motrin/nsaids at this time can counteract her blood pressure  medication/raise BP. Discussed if no improvement with conservative treatment eg splint/nsaids or tylenol consider orthopedics evaluation.  Exitcare handout trigger finger and carpal tunnel syndrome.  Patient will follow up  for re-evaluation in 48 hours appt scheduled.  Patient verbalized understanding information/instructions, agreed with plan of care and had no further questions at this time.

## 2018-08-24 ENCOUNTER — Ambulatory Visit: Payer: Self-pay | Admitting: Registered Nurse

## 2018-08-24 VITALS — BP 180/120 | HR 69 | Temp 98.5°F

## 2018-08-24 DIAGNOSIS — R202 Paresthesia of skin: Secondary | ICD-10-CM

## 2018-08-24 DIAGNOSIS — I10 Essential (primary) hypertension: Secondary | ICD-10-CM

## 2018-08-24 DIAGNOSIS — M65341 Trigger finger, right ring finger: Secondary | ICD-10-CM

## 2018-08-24 NOTE — Progress Notes (Signed)
Pt returns for BP recheck. Unchanged. Advised by NP to increase HCTZ to 25mg  daily and recheck BP with RN in clinic at different times of day during clinic hours over the next couple of weeks. POC for further BP management will be determined after that.   Reviewed and noted Hydrochlorothiazide increased to BID, labs tomorrow and continued BP checks random times Mon, Tues, Thur and Fri and consider adding amlodipine and lisinopril to her regimen as previously took them but changed to hydrochlorothiazide with PCM.  Reiterated Red Flags go to ER call [phone] symptoms with patient at appt today and Tuesday.

## 2018-08-24 NOTE — Patient Instructions (Signed)
Trigger Finger  Trigger finger (stenosing tenosynovitis) is a condition that causes a finger to get stuck in a bent position. Each finger has a tough, cord-like tissue that connects muscle to bone (tendon), and each tendon is surrounded by a tunnel of tissue (tendon sheath). To move your finger, your tendon needs to slide freely through the sheath. Trigger finger happens when the tendon or the sheath thickens, making it difficult to move your finger. Trigger finger can affect any finger or a thumb. It may affect more than one finger. Mild cases may clear up with rest and medicine. Severe cases require more treatment. What are the causes? Trigger finger is caused by a thickened finger tendon or tendon sheath. The cause of this thickening is not known. What increases the risk? The following factors may make you more likely to develop this condition:  Doing activities that require a strong grip.  Having rheumatoid arthritis, gout, or diabetes.  Being 71-49 years old.  Being a woman. What are the signs or symptoms? Symptoms of this condition include:  Pain when bending or straightening your finger.  Tenderness or swelling where your finger attaches to the palm of your hand.  A lump in the palm of your hand or on the inside of your finger.  Hearing a popping sound when you try to straighten your finger.  Feeling a popping, catching, or locking sensation when you try to straighten your finger.  Being unable to straighten your finger. How is this diagnosed? This condition is diagnosed based on your symptoms and a physical exam. How is this treated? This condition may be treated by:  Resting your finger and avoiding activities that make symptoms worse.  Wearing a finger splint to keep your finger in a slightly bent position.  Taking NSAIDs to relieve pain and swelling.  Injecting medicine (steroids) into the tendon sheath to reduce swelling and irritation. Injections may need to be  repeated.  Having surgery to open the tendon sheath. This may be done if other treatments do not work and you cannot straighten your finger. You may need physical therapy after surgery. Follow these instructions at home:   Use moist heat to help reduce pain and swelling as told by your health care provider.  Rest your finger and avoid activities that make pain worse. Return to normal activities as told by your health care provider.  If you have a splint, wear it as told by your health care provider.  Take over-the-counter and prescription medicines only as told by your health care provider.  Keep all follow-up visits as told by your health care provider. This is important. Contact a health care provider if:  Your symptoms are not improving with home care. Summary  Trigger finger (stenosing tenosynovitis) causes your finger to get stuck in a bent position, and it can make it difficult and painful to straighten your finger.  This condition develops when a finger tendon or tendon sheath thickens.  Treatment starts with resting, wearing a splint, and taking NSAIDs.  In severe cases, surgery to open the tendon sheath may be needed. This information is not intended to replace advice given to you by your health care provider. Make sure you discuss any questions you have with your health care provider. Document Released: 03/27/2004 Document Revised: 05/18/2016 Document Reviewed: 05/18/2016 Elsevier Interactive Patient Education  2019 [place] Syndrome  Carpal tunnel syndrome is a condition that causes pain in your hand and arm. The carpal tunnel is a  narrow area that is on the palm side of your wrist. Repeated wrist motion or certain diseases may cause swelling in the tunnel. This swelling can pinch the main nerve in the wrist (median nerve). What are the causes? This condition may be caused by:  Repeated wrist motions.  Wrist injuries.  Arthritis.  A sac of  fluid (cyst) or abnormal growth (tumor) in the carpal tunnel.  Fluid buildup during pregnancy. Sometimes the cause is not known. What increases the risk? The following factors may make you more likely to develop this condition:  Having a job in which you move your wrist in the same way many times. This includes jobs like being a Software engineer or a Scientist, water quality.  Being a woman.  Having other health conditions, such as: ? Diabetes. ? Obesity. ? A thyroid gland that is not active enough (hypothyroidism). ? Kidney failure. What are the signs or symptoms? Symptoms of this condition include:  A tingling feeling in your fingers.  Tingling or a loss of feeling (numbness) in your hand.  Pain in your entire arm. This pain may get worse when you bend your wrist and elbow for a long time.  Pain in your wrist that goes up your arm to your shoulder.  Pain that goes down into your palm or fingers.  A weak feeling in your hands. You may find it hard to grab and hold items. You may feel worse at night. How is this diagnosed? This condition is diagnosed with a medical history and physical exam. You may also have tests, such as:  Electromyogram (EMG). This test checks the signals that the nerves send to the muscles.  Nerve conduction study. This test checks how well signals pass through your nerves.  Imaging tests, such as X-rays, ultrasound, and MRI. These tests check for what might be the cause of your condition. How is this treated? This condition may be treated with:  Lifestyle changes. You will be asked to stop or change the activity that caused your problem.  Doing exercise and activities that make bones and muscles stronger (physical therapy).  Learning how to use your hand again (occupational therapy).  Medicines for pain and swelling (inflammation). You may have injections in your wrist.  A wrist splint.  Surgery. Follow these instructions at home: If you have a splint:  Wear the  splint as told by your doctor. Remove it only as told by your doctor.  Loosen the splint if your fingers: ? Tingle. ? Lose feeling (become numb). ? Turn cold and blue.  Keep the splint clean.  If the splint is not waterproof: ? Do not let it get wet. ? Cover it with a watertight covering when you take a bath or a shower. Managing pain, stiffness, and swelling   If told, put ice on the painful area: ? If you have a removable splint, remove it as told by your doctor. ? Put ice in a plastic bag. ? Place a towel between your skin and the bag. ? Leave the ice on for 20 minutes, 2-3 times per day. General instructions  Take over-the-counter and prescription medicines only as told by your doctor.  Rest your wrist from any activity that may cause pain. If needed, talk with your boss at work about changes that can help your wrist heal.  Do any exercises as told by your doctor, physical therapist, or occupational therapist.  Keep all follow-up visits as told by your doctor. This is important. Contact a doctor if:  You have new symptoms.  Medicine does not help your pain.  Your symptoms get worse. Get help right away if:  You have very bad numbness or tingling in your wrist or hand. Summary  Carpal tunnel syndrome is a condition that causes pain in your hand and arm.  It is often caused by repeated wrist motions.  Lifestyle changes and medicines are used to treat this problem. Surgery may help in very bad cases.  Follow your doctor's instructions about wearing a splint, resting your wrist, keeping follow-up visits, and calling for help. This information is not intended to replace advice given to you by your health care provider. Make sure you discuss any questions you have with your health care provider. Document Released: 05/27/2011 Document Revised: 10/14/2017 Document Reviewed: 10/14/2017 Elsevier Interactive Patient Education  2019 [employer]. Managing Your  Hypertension Hypertension is commonly called high blood pressure. This is when the force of your blood pressing against the walls of your arteries is too strong. Arteries are blood vessels that carry blood from your heart throughout your body. Hypertension forces the heart to work harder to pump blood, and may cause the arteries to become narrow or stiff. Having untreated or uncontrolled hypertension can cause heart attack, stroke, kidney disease, and other problems. What are blood pressure readings? A blood pressure reading consists of a higher number over a lower number. Ideally, your blood pressure should be below 120/80. The first ("top") number is called the systolic pressure. It is a measure of the pressure in your arteries as your heart beats. The second ("bottom") number is called the diastolic pressure. It is a measure of the pressure in your arteries as the heart relaxes. What does my blood pressure reading mean? Blood pressure is classified into four stages. Based on your blood pressure reading, your health care provider may use the following stages to determine what type of treatment you need, if any. Systolic pressure and diastolic pressure are measured in a unit called mm Hg. Normal  Systolic pressure: below 762.  Diastolic pressure: below 80. Elevated  Systolic pressure: 831-517.  Diastolic pressure: below 80. Hypertension stage 1  Systolic pressure: 616-073.  Diastolic pressure: 71-06. Hypertension stage 2  Systolic pressure: 269 or above.  Diastolic pressure: 90 or above. What health risks are associated with hypertension? Managing your hypertension is an important responsibility. Uncontrolled hypertension can lead to:  A heart attack.  A stroke.  A weakened blood vessel (aneurysm).  Heart failure.  Kidney damage.  Eye damage.  Metabolic syndrome.  Memory and concentration problems. What changes can I make to manage my hypertension? Hypertension can be  managed by making lifestyle changes and possibly by taking medicines. Your health care provider will help you make a plan to bring your blood pressure within a normal range. Eating and drinking   Eat a diet that is high in fiber and potassium, and low in salt (sodium), added sugar, and fat. An example eating plan is called the DASH (Dietary Approaches to Stop Hypertension) diet. To eat this way: ? Eat plenty of fresh fruits and vegetables. Try to fill half of your plate at each meal with fruits and vegetables. ? Eat whole grains, such as whole wheat pasta, brown rice, or whole grain bread. Fill about one quarter of your plate with whole grains. ? Eat low-fat diary products. ? Avoid fatty cuts of meat, processed or cured meats, and poultry with skin. Fill about one quarter of your plate with lean proteins such  as fish, chicken without skin, beans, eggs, and tofu. ? Avoid premade and processed foods. These tend to be higher in sodium, added sugar, and fat.  Reduce your daily sodium intake. Most people with hypertension should eat less than 1,500 mg of sodium a day.  Limit alcohol intake to no more than 1 drink a day for nonpregnant women and 2 drinks a day for men. One drink equals 12 oz of beer, 5 oz of wine, or 1 oz of hard liquor. Lifestyle  Work with your health care provider to maintain a healthy body weight, or to lose weight. Ask what an ideal weight is for you.  Get at least 30 minutes of exercise that causes your heart to beat faster (aerobic exercise) most days of the week. Activities may include walking, swimming, or biking.  Include exercise to strengthen your muscles (resistance exercise), such as weight lifting, as part of your weekly exercise routine. Try to do these types of exercises for 30 minutes at least 3 days a week.  Do not use any products that contain nicotine or tobacco, such as cigarettes and e-cigarettes. If you need help quitting, ask your health care  provider.  Control any long-term (chronic) conditions you have, such as high cholesterol or diabetes. Monitoring  Monitor your blood pressure at home as told by your health care provider. Your personal target blood pressure may vary depending on your medical conditions, your age, and other factors.  Have your blood pressure checked regularly, as often as told by your health care provider. Working with your health care provider  Review all the medicines you take with your health care provider because there may be side effects or interactions.  Talk with your health care provider about your diet, exercise habits, and other lifestyle factors that may be contributing to hypertension.  Visit your health care provider regularly. Your health care provider can help you create and adjust your plan for managing hypertension. Will I need medicine to control my blood pressure? Your health care provider may prescribe medicine if lifestyle changes are not enough to get your blood pressure under control, and if:  Your systolic blood pressure is 130 or higher.  Your diastolic blood pressure is 80 or higher. Take medicines only as told by your health care provider. Follow the directions carefully. Blood pressure medicines must be taken as prescribed. The medicine does not work as well when you skip doses. Skipping doses also puts you at risk for problems. Contact a health care provider if:  You think you are having a reaction to medicines you have taken.  You have repeated (recurrent) headaches.  You feel dizzy.  You have swelling in your ankles.  You have trouble with your vision. Get help right away if:  You develop a severe headache or confusion.  You have unusual weakness or numbness, or you feel faint.  You have severe pain in your chest or abdomen.  You vomit repeatedly.  You have trouble breathing. Summary  Hypertension is when the force of blood pumping through your arteries is  too strong. If this condition is not controlled, it may put you at risk for serious complications.  Your personal target blood pressure may vary depending on your medical conditions, your age, and other factors. For most people, a normal blood pressure is less than 120/80.  Hypertension is managed by lifestyle changes, medicines, or both. Lifestyle changes include weight loss, eating a healthy, low-sodium diet, exercising more, and limiting alcohol. This information  is not intended to replace advice given to you by your health care provider. Make sure you discuss any questions you have with your health care provider. Document Released: 03/01/2012 Document Revised: 05/05/2016 Document Reviewed: 05/05/2016 Elsevier Interactive Patient Education  2019 [employer].

## 2018-08-24 NOTE — Progress Notes (Signed)
Subjective:    Patient ID: Cassie Mills, female    DOB: 1975-09-30, 43 y.o.   MRN: [id number]  42y/o African-American newly established female pt presenting for f/u of elevated BP and bilateral wrist/hand pain and right ring finger joint pain/decreased range of motion. Pt reports no change in hand pain since wearing brace on R wrist. States she has been wearing during shifts at work but removes it when it interferes with her job duties and at night to bed. She did not try wrist splints last time for hand pain but orthopedics did steroid injections at wrists.  Right hand dominant.  Has been working approximately 1 month at [employer] job for her/duties with repetitive motion denied other hobbies/exercises/gardening.  Has noticed right ring finger not able to straighten fully/painful slightly swollen at middle joint.  Denied locking/popping/rash/increased temperature.  Denied history of gout. Pt reports palpitations are unchanged since Tuesday. Was unable to come back to clinic after appt Tuesday due to business of training and shift work requirements. Took second dose of HCTZ when she got home that day. One 12.5mg  dose yesterday and today am. Pt reports HA yesterday which is new for her. She typically does not have this as a sx of her HTN. She does have hydrochlorothiazide medication with her at work today. Denied visual changes, chest pain, sob, dyspnea, worst headache of her life, arm/leg weakness, dysphagia, dysphasia.     Review of Systems  Constitutional: Negative for activity change, appetite change, chills, diaphoresis, fatigue and fever.  HENT: Negative for congestion, trouble swallowing and voice change.   Eyes: Negative for photophobia and visual disturbance.  Respiratory: Negative for cough, chest tightness, shortness of breath, wheezing and stridor.   Cardiovascular: Positive for palpitations. Negative for chest pain and leg swelling.  Gastrointestinal: Negative for abdominal  pain, constipation, diarrhea, nausea and vomiting.  Endocrine: Negative for cold intolerance and heat intolerance.  Genitourinary: Negative for difficulty urinating.  Musculoskeletal: Positive for arthralgias, joint swelling and myalgias. Negative for back pain, gait problem, neck pain and neck stiffness.  Skin: Negative for color change, pallor, rash and wound.  Allergic/Immunologic: Negative for environmental allergies and food allergies.  Neurological: Positive for numbness and headaches. Negative for dizziness, tremors, seizures, syncope, facial asymmetry, speech difficulty, weakness and light-headedness.  Psychiatric/Behavioral: Negative for agitation, confusion and sleep disturbance.       Objective:   Physical Exam Constitutional:      General: She is awake.     Appearance: Normal appearance. She is well-developed and well-groomed. She is obese. She is not ill-appearing, toxic-appearing or diaphoretic.  HENT:     Head: Normocephalic and atraumatic.     Jaw: There is normal jaw occlusion.     Salivary Glands: Right salivary gland is not diffusely enlarged or tender. Left salivary gland is not diffusely enlarged or tender.     Right Ear: Hearing and external ear normal.     Left Ear: Hearing and external ear normal.     Nose: Nose normal. No congestion or rhinorrhea.     Right Turbinates: Not enlarged or swollen.     Left Turbinates: Not enlarged or swollen.     Right Sinus: No maxillary sinus tenderness or frontal sinus tenderness.     Left Sinus: No maxillary sinus tenderness or frontal sinus tenderness.     Mouth/Throat:     Lips: Pink. No lesions.     Mouth: Mucous membranes are moist. No oral lesions or angioedema.  Tongue: No lesions.     Pharynx: Uvula midline. No pharyngeal swelling, oropharyngeal exudate, posterior oropharyngeal erythema or uvula swelling.     Tonsils: No tonsillar exudate. Swelling: 0 on the right. 0 on the left.  Eyes:     General: Lids are  normal. No visual field deficit or scleral icterus.    Extraocular Movements: Extraocular movements intact.     Right eye: Normal extraocular motion and no nystagmus.     Left eye: Normal extraocular motion and no nystagmus.     Conjunctiva/sclera: Conjunctivae normal.     Right eye: Right conjunctiva is not injected. No exudate.    Left eye: Left conjunctiva is not injected. No exudate.    Pupils: Pupils are equal, round, and reactive to light.  Neck:     Musculoskeletal: Normal range of motion and neck supple.     Trachea: Trachea and phonation normal.  Cardiovascular:     Rate and Rhythm: Normal rate and regular rhythm.     Pulses: Normal pulses.          Radial pulses are 2+ on the right side and 2+ on the left side.     Heart sounds: Normal heart sounds.  Pulmonary:     Effort: Pulmonary effort is normal. No respiratory distress.     Breath sounds: Normal breath sounds and air entry. No stridor, decreased air movement or transmitted upper airway sounds. No decreased breath sounds, wheezing, rhonchi or rales.     Comments: No cough observed in exam room; spoke full sentences without difficulty Abdominal:     Palpations: Abdomen is soft.  Musculoskeletal:        General: Swelling, tenderness and deformity present.     Right shoulder: Normal.     Left shoulder: Normal.     Right elbow: Normal.    Left elbow: Normal.     Right wrist: Normal. She exhibits normal range of motion, no tenderness, no bony tenderness, no swelling, no effusion, no crepitus, no deformity and no laceration.     Left wrist: Normal. She exhibits normal range of motion, no tenderness, no bony tenderness, no swelling, no effusion, no crepitus, no deformity and no laceration.     Right hip: Normal.     Left hip: Normal.     Right knee: Normal.     Left knee: Normal.     Right ankle: Normal.     Left ankle: Normal.     Cervical back: Normal.     Thoracic back: Normal.     Lumbar back: Normal.     Right  forearm: Normal.     Left forearm: Normal.     Right hand: She exhibits decreased range of motion, tenderness, deformity and swelling. She exhibits no bony tenderness, normal two-point discrimination, normal capillary refill and no laceration. Normal sensation noted. Normal strength noted. She exhibits no finger abduction, no thumb/finger opposition and no wrist extension trouble.     Left hand: Normal.       Hands:     Right lower leg: No edema.     Left lower leg: No edema.     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     Left side of head: No submental, submandibular, tonsillar, preauricular, posterior auricular or occipital adenopathy.     Cervical: No cervical adenopathy.     Right cervical: No superficial, deep or posterior cervical adenopathy.    Left cervical: No superficial, deep or posterior cervical adenopathy.  Skin:    General: Skin is warm and dry.     Capillary Refill: Capillary refill takes less than 2 seconds.     Coloration: Skin is not ashen, cyanotic, jaundiced, mottled, pale or sallow.     Findings: No abrasion, abscess, acne, bruising, burn, ecchymosis, erythema, laceration, lesion, petechiae, rash or wound.     Nails: There is no  clubbing.   Neurological:     General: No focal deficit present.     Mental Status: She is alert and oriented to person, place, and time. Mental status is at baseline.     GCS: GCS eye subscore is 4. GCS verbal subscore is 5. GCS motor subscore is 6.     Cranial Nerves: Cranial nerves are intact. No cranial nerve deficit, dysarthria or facial asymmetry.     Sensory: Sensation is intact. No sensory deficit.     Motor: Motor function is intact. No weakness, tremor, atrophy, abnormal muscle tone or seizure activity.     Coordination: Coordination is intact. Coordination normal.     Gait: Gait is intact. Gait normal.     Comments: Gait sure and steady in hallway; in/out of chair without difficulty  Psychiatric:        Attention and Perception: Attention and perception normal.        Mood and Affect: Mood and affect normal.        Speech: Speech normal.        Behavior: Behavior normal. Behavior is cooperative.        Thought Content: Thought content normal.        Cognition and Memory: Cognition and memory normal.        Judgment: Judgment normal.      Finger splint and left wrist large and finger splint velcro fitted and distributed by RN Hildred Alamin to patient from clinic stock Discussed take second 12.5 mg hydrochlortohiazide now and follow up with RN Hildred Alamin prior to 3pm for repeat blood pressure check ER Red Flags discussed patient and she denied current red flags e.g. worst headache of life, chest pain, jaw/left arm pain, nausea, "elephant on chest", heartburn, visual disturbances  Patient verbalized understanding of when to go to ER/call 911 for re-evaluation and had no further questions at this time.  Not wearing splint right stated at her workstation when she presented for appt discussed with patient it is important to wear consistently except for shower/bath at work and while sleeping for the next two weeks and taking tylenol 1000mg  po QID prn pain; biofreeze topical gel 4% QID prn pain,  gentle AROM daily and cryotherapy 15 minutes QID and to elevate prn if swelling noted  Reusable ice pack thinks she has at home but will return if cannot find to get one from RN Haley/clinic stock; Printed AVS and given to patient  Patient verbalized understanding information/instructions, agreed with  plan of care and had no further questions at this time.  Follow up for random blood pressure rechecks daily for the next week with RN on Monday/Tuesday/Thursdays and Fridays.  Take her hydrochlorothiazide every day and will increase to 12.5mg  po BID; avoid motrin/naprosyn advil/aleve as can counteract blood pressure medication and raise BP; tylenol only due to blood pressure elevation.  Avoid more than 8 oz caffeine intake per day; Avoid OTC medications.  Discussed with patient on chart review she was previously taking amlodipine and lisinopril and we have them on formulary at [facility] and we may need to restart her on one or both of these.  Labs scheduled for tomorrow.  Will follow up next week after taking 25mg  hydrochlorothiazide daily and some other random BP checks available for review.  Patient verbalized understanding information/instructions, agreed with plan of care and had no further questions at this time.        Assessment & Plan:  A-right 4th digit trigger finger acute; bilateral wrist pain/paresthesias hands acute, essential hypertension  P-exitcare trigger finger.  Wear finger splint next 6 weeks do not remove except for skin hygiene. Discussed per up to date 6 weeks wear typical.  Tylenol 1000mg  po QID prn pain.  Once blood pressure controlled can consider adding motrin if needed if labs normal for renal function.  Consider orthopedics consult if no improvement with conservative care.  Patient verbalized understanding information/instructions, agreed with plan of care and had no further questions at this time.  Reiterated splint use at work and while sleeping may remove for gentle arom.   Avoid impact to bilateral hands.  [facility] labs pending tomorrow.  See plan of care above.  Exitcare handout carpal tunnel syndrome.  exitcare handout managing hypertension.  See plan of care above.  Will need follow up with PCM   Fasting labs tomorrow with RN Hildred Alamin and continued blood pressure checks, taking her hydrochlorothiazide increased dosage this next week every day.  Patient verbalized understanding information/instructions, agreed with plan of care and had no further questions at this time.

## 2018-08-25 ENCOUNTER — Ambulatory Visit: Payer: Self-pay | Admitting: *Deleted

## 2018-08-25 VITALS — BP 154/123 | HR 74 | Ht 65.0 in | Wt 250.0 lb

## 2018-08-25 DIAGNOSIS — Z0000 Encounter for general adult medical examination without abnormal findings: Secondary | ICD-10-CM

## 2018-08-25 MED ORDER — ACETAMINOPHEN 500 MG PO TABS: 1000.0000 mg | ORAL_TABLET | Freq: Four times a day (QID) | ORAL | 0 refills | Status: AC | PRN

## 2018-08-25 MED ORDER — HYDROCHLOROTHIAZIDE 12.5 MG PO CAPS: 12.5000 mg | ORAL_CAPSULE | Freq: Two times a day (BID) | ORAL | 0 refills | Status: DC

## 2018-08-25 MED ORDER — MENTHOL (TOPICAL ANALGESIC) 4 % EX GEL: 1.0000 "application " | Freq: Four times a day (QID) | CUTANEOUS | 0 refills | Status: AC | PRN

## 2018-08-25 NOTE — Progress Notes (Signed)
[employer] insurance premium discount evaluation: Labs Drawn. Replacements ROI form signed. Tobacco Free Attestation form signed.  Forms placed in paper chart. Okay to route results to pcp office per pt.

## 2018-08-26 LAB — HGB A1C W/O EAG: HEMOGLOBIN A1C: 5.5 % (ref 4.8–5.6)

## 2018-08-26 LAB — CMP12+LP+TP+TSH+6AC+CBC/D/PLT
ALBUMIN: 4.1 g/dL (ref 3.8–4.8)
ALT: 7 IU/L (ref 0–32)
AST: 18 IU/L (ref 0–40)
Albumin/Globulin Ratio: 1.5 (ref 1.2–2.2)
Alkaline Phosphatase: 74 IU/L (ref 39–117)
BASOS: 1 %
BUN/Creatinine Ratio: 13 (ref 9–23)
BUN: 12 mg/dL (ref 6–24)
Basophils Absolute: 0 10*3/uL (ref 0.0–0.2)
Bilirubin Total: 0.2 mg/dL (ref 0.0–1.2)
CALCIUM: 9.4 mg/dL (ref 8.7–10.2)
CHLORIDE: 102 mmol/L (ref 96–106)
CHOLESTEROL TOTAL: 128 mg/dL (ref 100–199)
Chol/HDL Ratio: 2.3 ratio (ref 0.0–4.4)
Creatinine, Ser: 0.9 mg/dL (ref 0.57–1.00)
EOS (ABSOLUTE): 0.1 10*3/uL (ref 0.0–0.4)
Eos: 2 %
FREE THYROXINE INDEX: 2 (ref 1.2–4.9)
GFR calc Af Amer: 90 mL/min/{1.73_m2} (ref >59)
GFR, EST NON AFRICAN AMERICAN: 78 mL/min/{1.73_m2} (ref >59)
GGT: 12 IU/L (ref 0–60)
GLUCOSE: 88 mg/dL (ref 65–99)
Globulin, Total: 2.7 g/dL (ref 1.5–4.5)
HDL: 55 mg/dL (ref >39)
HEMATOCRIT: 35.2 % (ref 34.0–46.6)
Hemoglobin: 11.6 g/dL (ref 11.1–15.9)
IMMATURE GRANULOCYTES: 0 %
IRON: 25 ug/dL — AB (ref 27–159)
Immature Grans (Abs): 0 10*3/uL (ref 0.0–0.1)
LDH: 271 IU/L — ABNORMAL HIGH (ref 119–226)
LDL CALC: 64 mg/dL (ref 0–99)
LYMPHS ABS: 1.2 10*3/uL (ref 0.7–3.1)
Lymphs: 31 %
MCH: 25.7 pg — AB (ref 26.6–33.0)
MCHC: 33 g/dL (ref 31.5–35.7)
MCV: 78 fL — ABNORMAL LOW (ref 79–97)
MONOS ABS: 0.3 10*3/uL (ref 0.1–0.9)
Monocytes: 7 %
NEUTROS PCT: 59 %
Neutrophils Absolute: 2.3 10*3/uL (ref 1.4–7.0)
POTASSIUM: 3.9 mmol/L (ref 3.5–5.2)
Phosphorus: 3.7 mg/dL (ref 3.0–4.3)
Platelets: 256 10*3/uL (ref 150–450)
RBC: 4.51 x10E6/uL (ref 3.77–5.28)
RDW: 15.6 % — ABNORMAL HIGH (ref 11.7–15.4)
SODIUM: 139 mmol/L (ref 134–144)
T3 UPTAKE RATIO: 22 % — AB (ref 24–39)
T4, Total: 9.3 ug/dL (ref 4.5–12.0)
TSH: 0.996 u[IU]/mL (ref 0.450–4.500)
Total Protein: 6.8 g/dL (ref 6.0–8.5)
Triglycerides: 47 mg/dL (ref 0–149)
Uric Acid: 5.2 mg/dL (ref 2.5–7.1)
VLDL Cholesterol Cal: 9 mg/dL (ref 5–40)
WBC: 3.9 10*3/uL (ref 3.4–10.8)

## 2018-08-29 ENCOUNTER — Encounter: Payer: Self-pay | Admitting: Registered Nurse

## 2018-08-29 ENCOUNTER — Ambulatory Visit: Payer: Self-pay | Admitting: Registered Nurse

## 2018-08-29 VITALS — BP 222/120 | HR 71 | Temp 97.9°F

## 2018-08-29 DIAGNOSIS — M6530 Trigger finger, unspecified finger: Secondary | ICD-10-CM

## 2018-08-29 DIAGNOSIS — M25531 Pain in right wrist: Secondary | ICD-10-CM

## 2018-08-29 DIAGNOSIS — I10 Essential (primary) hypertension: Secondary | ICD-10-CM

## 2018-08-29 MED ORDER — AMLODIPINE BESYLATE 5 MG PO TABS: 5.0000 mg | ORAL_TABLET | Freq: Every day | ORAL | 0 refills | Status: DC

## 2018-08-29 NOTE — Patient Instructions (Signed)
Managing Your Hypertension  Hypertension is commonly called high blood pressure. This is when the force of your blood pressing against the walls of your arteries is too strong. Arteries are blood vessels that carry blood from your heart throughout your body. Hypertension forces the heart to work harder to pump blood, and may cause the arteries to become narrow or stiff. Having untreated or uncontrolled hypertension can cause heart attack, stroke, kidney disease, and other problems.  What are blood pressure readings?  A blood pressure reading consists of a higher number over a lower number. Ideally, your blood pressure should be below 120/80. The first ("top") number is called the systolic pressure. It is a measure of the pressure in your arteries as your heart beats. The second ("bottom") number is called the diastolic pressure. It is a measure of the pressure in your arteries as the heart relaxes.  What does my blood pressure reading mean?  Blood pressure is classified into four stages. Based on your blood pressure reading, your health care provider may use the following stages to determine what type of treatment you need, if any. Systolic pressure and diastolic pressure are measured in a unit called mm Hg.  Normal   Systolic pressure: below 120.   Diastolic pressure: below 80.  Elevated   Systolic pressure: 120-129.   Diastolic pressure: below 80.  Hypertension stage 1   Systolic pressure: 130-139.   Diastolic pressure: 80-89.  Hypertension stage 2   Systolic pressure: 140 or above.   Diastolic pressure: 90 or above.  What health risks are associated with hypertension?  Managing your hypertension is an important responsibility. Uncontrolled hypertension can lead to:   A heart attack.   A stroke.   A weakened blood vessel (aneurysm).   Heart failure.   Kidney damage.   Eye damage.   Metabolic syndrome.   Memory and concentration problems.  What changes can I make to manage my  hypertension?  Hypertension can be managed by making lifestyle changes and possibly by taking medicines. Your health care provider will help you make a plan to bring your blood pressure within a normal range.  Eating and drinking     Eat a diet that is high in fiber and potassium, and low in salt (sodium), added sugar, and fat. An example eating plan is called the DASH (Dietary Approaches to Stop Hypertension) diet. To eat this way:  ? Eat plenty of fresh fruits and vegetables. Try to fill half of your plate at each meal with fruits and vegetables.  ? Eat whole grains, such as whole wheat pasta, brown rice, or whole grain bread. Fill about one quarter of your plate with whole grains.  ? Eat low-fat diary products.  ? Avoid fatty cuts of meat, processed or cured meats, and poultry with skin. Fill about one quarter of your plate with lean proteins such as fish, chicken without skin, beans, eggs, and tofu.  ? Avoid premade and processed foods. These tend to be higher in sodium, added sugar, and fat.   Reduce your daily sodium intake. Most people with hypertension should eat less than 1,500 mg of sodium a day.   Limit alcohol intake to no more than 1 drink a day for nonpregnant women and 2 drinks a day for men. One drink equals 12 oz of beer, 5 oz of wine, or 1 oz of hard liquor.  Lifestyle   Work with your health care provider to maintain a healthy body weight, or to lose   weight. Ask what an ideal weight is for you.   Get at least 30 minutes of exercise that causes your heart to beat faster (aerobic exercise) most days of the week. Activities may include walking, swimming, or biking.   Include exercise to strengthen your muscles (resistance exercise), such as weight lifting, as part of your weekly exercise routine. Try to do these types of exercises for 30 minutes at least 3 days a week.   Do not use any products that contain nicotine or tobacco, such as cigarettes and e-cigarettes. If you need help quitting,  ask your health care provider.   Control any long-term (chronic) conditions you have, such as high cholesterol or diabetes.  Monitoring   Monitor your blood pressure at home as told by your health care provider. Your personal target blood pressure may vary depending on your medical conditions, your age, and other factors.   Have your blood pressure checked regularly, as often as told by your health care provider.  Working with your health care provider   Review all the medicines you take with your health care provider because there may be side effects or interactions.   Talk with your health care provider about your diet, exercise habits, and other lifestyle factors that may be contributing to hypertension.   Visit your health care provider regularly. Your health care provider can help you create and adjust your plan for managing hypertension.  Will I need medicine to control my blood pressure?  Your health care provider may prescribe medicine if lifestyle changes are not enough to get your blood pressure under control, and if:   Your systolic blood pressure is 130 or higher.   Your diastolic blood pressure is 80 or higher.  Take medicines only as told by your health care provider. Follow the directions carefully. Blood pressure medicines must be taken as prescribed. The medicine does not work as well when you skip doses. Skipping doses also puts you at risk for problems.  Contact a health care provider if:   You think you are having a reaction to medicines you have taken.   You have repeated (recurrent) headaches.   You feel dizzy.   You have swelling in your ankles.   You have trouble with your vision.  Get help right away if:   You develop a severe headache or confusion.   You have unusual weakness or numbness, or you feel faint.   You have severe pain in your chest or abdomen.   You vomit repeatedly.   You have trouble breathing.  Summary   Hypertension is when the force of blood pumping  through your arteries is too strong. If this condition is not controlled, it may put you at risk for serious complications.   Your personal target blood pressure may vary depending on your medical conditions, your age, and other factors. For most people, a normal blood pressure is less than 120/80.   Hypertension is managed by lifestyle changes, medicines, or both. Lifestyle changes include weight loss, eating a healthy, low-sodium diet, exercising more, and limiting alcohol.  This information is not intended to replace advice given to you by your health care provider. Make sure you discuss any questions you have with your health care provider.  Document Released: 03/01/2012 Document Revised: 05/05/2016 Document Reviewed: 05/05/2016  [employer] Interactive Patient Education  2019 [employer]

## 2018-08-29 NOTE — Progress Notes (Signed)
Subjective:    Patient ID: Cassie Mills, female    DOB: June 07, 1976, 43 y.o.   MRN: [id number]  43y/o African-American female pt presenting for f/u of HTN, finger and hand/wrist pain.  Patient to review [facility] lab results with RN Hildred Alamin.  She has been taking 25mg  of Hydrochlorothiazide daily.  Palpitations unchanged not decreasing or increasing in frequency.  Denied visual changes/headaches/hand/feet swelling/chest pain/diaphoresis/dyspnea.  Patient has been wearing right index finger splint and stated decreased pain and joint straightening out more.  Typically wearing wrist splints only at night as interfering with work duties but has noticed decreased pain right hand/wrist.  Per epic chart review previously on amlodipine 5mg  take 2 tablets po daily and lisinopril 10mg  po daily Oct 2017 [facility] Patient reported she did develop a mild cough when taking lisinopril but otherwise tolerated well.  PCM but then switched PCMs and was changed to hydrochlorothiazide 12.5mg  po daily.  I increased her to 25mg  po daily last week and BPs continue to be elevated on recheck with RN Hildred Alamin varied times/days.  Labs completed last week renal and liver function normal.  LMP 2 weeks heavy flow     Review of Systems  Constitutional: Negative for activity change, appetite change, chills, diaphoresis, fatigue and fever.  HENT: Negative for trouble swallowing and voice change.   Eyes: Negative for photophobia and visual disturbance.  Respiratory: Negative for cough, choking, chest tightness, shortness of breath, wheezing and stridor.   Cardiovascular: Positive for palpitations. Negative for chest pain and leg swelling.  Gastrointestinal: Negative for abdominal pain, diarrhea, nausea and vomiting.  Endocrine: Negative for cold intolerance and heat intolerance.  Genitourinary: Negative for difficulty urinating.  Musculoskeletal: Positive for arthralgias, joint swelling and myalgias. Negative for back pain, gait problem,  neck pain and neck stiffness.  Skin: Negative for color change, pallor, rash and wound.  Allergic/Immunologic: Negative for environmental allergies and food allergies.  Neurological: Positive for numbness. Negative for dizziness, tremors, seizures, syncope, facial asymmetry, speech difficulty, weakness, light-headedness and headaches.  Hematological: Negative for adenopathy. Does not bruise/bleed easily.  Psychiatric/Behavioral: Negative for agitation, confusion and sleep disturbance.       Objective:   Physical Exam Vitals signs and nursing note reviewed.  Constitutional:      General: She is not in acute distress.    Appearance: Normal appearance. She is well-developed. She is obese. She is not ill-appearing, toxic-appearing or diaphoretic.  HENT:     Head: Normocephalic and atraumatic.     Right Ear: External ear normal.     Left Ear: External ear normal.     Nose: Nose normal. No congestion or rhinorrhea.     Mouth/Throat:     Mouth: Mucous membranes are moist.     Pharynx: No oropharyngeal exudate or posterior oropharyngeal erythema.  Eyes:     General: Lids are normal. No visual field deficit.    Extraocular Movements: Extraocular movements intact.     Conjunctiva/sclera: Conjunctivae normal.     Pupils: Pupils are equal, round, and reactive to light.  Neck:     Musculoskeletal: Normal range of motion and neck supple. No neck rigidity.     Trachea: Trachea normal.  Cardiovascular:     Rate and Rhythm: Normal rate and regular rhythm.     Heart sounds: Normal heart sounds.  Pulmonary:     Effort: Pulmonary effort is normal. No respiratory distress.     Breath sounds: Normal breath sounds and air entry. No stridor, decreased air  movement or transmitted upper airway sounds. No decreased breath sounds, wheezing, rhonchi or rales.     Comments: No cough observed in exam room; spoke full sentences without difficulty Abdominal:     Palpations: Abdomen is soft.  Musculoskeletal:         General: No swelling or tenderness.     Right shoulder: Normal.     Left shoulder: Normal.     Right elbow: Normal.    Left elbow: Normal.     Right wrist: Normal.     Left wrist: Normal.     Right hip: Normal.     Left hip: Normal.     Right knee: Normal.     Left knee: Normal.     Right ankle: Normal.     Left ankle: Normal.     Cervical back: Normal.     Thoracic back: Normal.     Lumbar back: Normal.     Right forearm: Normal.     Left forearm: Normal.     Right hand: She exhibits decreased range of motion. She exhibits normal capillary refill, no laceration and no swelling. Normal sensation noted.     Left hand: Normal.     Right lower leg: No edema.     Left lower leg: No edema.     Comments: Right finger splint 4th digit held in place with cobain noted PIP joint less flexion noted at rest today and closer to full extension of joint at rest in splint patient stated pain also decreased slightly; patient not wearing wrist splints; full arom wrists bilaterally no swelling noted   Skin:    General: Skin is warm and dry.     Capillary Refill: Capillary refill takes less than 2 seconds.     Coloration: Skin is not jaundiced or pale.     Findings: No bruising, erythema, lesion or rash.  Neurological:     General: No focal deficit present.     Mental Status: She is alert and oriented to person, place, and time. Mental status is at baseline.     Cranial Nerves: Cranial nerves are intact. No cranial nerve deficit, dysarthria or facial asymmetry.     Sensory: Sensation is intact. No sensory deficit.     Motor: Motor function is intact. No weakness, tremor, atrophy, abnormal muscle tone or seizure activity.     Coordination: Coordination is intact. Coordination normal.     Gait: Gait is intact. Gait normal.     Comments: Gait sure and steady in hallway; in/out of chair without difficulty;bilateral hand grasp equal strength 5/5  Psychiatric:        Mood and Affect: Mood normal.         Speech: Speech normal.        Behavior: Behavior normal.        Thought Content: Thought content normal.        Judgment: Judgment normal.           Assessment & Plan:  A-essential hypertension, trigger finger right 4th subsequent visit, bilateral wrist pain/paresthesias hands acute subsequent visit  P-improved joint range of motion and decreased pain with splint.  continue  Wearing finger splint next 6 weeks do not remove except for skin hygiene. Discussed per up to date 6 weeks wear typical.  Tylenol 1000mg  po QID prn pain.  Once blood pressure controlled can consider adding motrin if needed if labs normal for renal function.  Consider orthopedics consult if no improvement with conservative care.  Patient verbalized understanding information/instructions, agreed with plan of care and had no further questions at this time.  Reiterated wrist bilateral splint use at work and while sleeping may remove for gentle arom.  Avoid impact to bilateral hands. Cryotherapy 15 minutes TID prn pain/swelling.  Tylenol 1000mg  po QID prn pain.  Follow up 1 week for re-evaluation.  Patient verbalized understanding information/instructions, agreed with plan of care and had no further questions at this time.  exitcare handout managing hypertension. She has scheduled follow up with PCM 13 Sep 2018.  Renal and liver function and electrolytes normal on recent labs.  Adding amlodipine 5mg  po daily today and patient will return this afternoon for repeat blood pressure check.  If still greater than 200/100 will have patient take second 5mg  po amlodipine as dispensed from [facility] to patient today.  Previously on 10mg  po daily in 2017 without side effects.  Holding on lisinopril due to patient noted cough while taking and continue hydrochlorothiazide 25mg  po daily.  Discussed with patient the two medications have different methods of lowering blood pressure and continue taking both.   RN Hildred Alamin continued blood pressure  checks, taking her hydrochlorothiazide and amlodipine increased dosage this next week every day.  Patient verbalized understanding information/instructions, agreed with plan of care and had no further questions at this time.

## 2018-09-18 ENCOUNTER — Other Ambulatory Visit: Payer: Self-pay

## 2018-09-18 ENCOUNTER — Ambulatory Visit: Payer: Self-pay | Admitting: *Deleted

## 2018-09-18 VITALS — BP 180/98

## 2018-09-18 DIAGNOSIS — I10 Essential (primary) hypertension: Secondary | ICD-10-CM

## 2018-09-18 NOTE — Progress Notes (Signed)
Pt was supposed to be in at 0600 but overslept and in at 1030. Has not taken any meds this am due to this. Normally has been taking 5mg  Amlodipine and 25mg  HCTZ in am and 5mg  Amlodipine in evening. Advised pt to RTC tomorrow at 0845 for recheck with meds on board. She is agreeable to this.  She reports hectic, stressful weekend. "Know my BP has been up because I had a nosebleed this weekend which hasn't happened in a long time." Pt reports only mild HA currently, but believes that may be more stress induced than BP related as she normally does not have HA's with her elevated BP. Pt reminded of ER precautions discussed during previous OV. She denies further questions/concerns.

## 2018-09-28 ENCOUNTER — Other Ambulatory Visit: Payer: Self-pay

## 2018-09-28 ENCOUNTER — Encounter: Payer: Self-pay | Admitting: Registered Nurse

## 2018-09-28 ENCOUNTER — Ambulatory Visit: Payer: Self-pay | Admitting: Registered Nurse

## 2018-09-28 VITALS — BP 159/110 | HR 80

## 2018-09-28 DIAGNOSIS — I10 Essential (primary) hypertension: Secondary | ICD-10-CM

## 2018-09-28 DIAGNOSIS — M25571 Pain in right ankle and joints of right foot: Secondary | ICD-10-CM

## 2018-09-28 DIAGNOSIS — M65341 Trigger finger, right ring finger: Secondary | ICD-10-CM

## 2018-09-28 MED ORDER — ACETAMINOPHEN 500 MG PO TABS: 1000.0000 mg | ORAL_TABLET | Freq: Four times a day (QID) | ORAL | 0 refills | Status: AC | PRN

## 2018-09-28 MED ORDER — AMLODIPINE BESYLATE 5 MG PO TABS: 5.0000 mg | ORAL_TABLET | Freq: Two times a day (BID) | ORAL | 0 refills | Status: DC

## 2018-09-28 MED ORDER — HYDROCHLOROTHIAZIDE 25 MG PO TABS: 25.0000 mg | ORAL_TABLET | Freq: Every morning | ORAL | 0 refills | Status: DC

## 2018-09-28 MED ORDER — LISINOPRIL 10 MG PO TABS: 10.0000 mg | ORAL_TABLET | Freq: Every day | ORAL | 0 refills | Status: DC

## 2018-09-28 NOTE — Patient Instructions (Signed)
Trigger Finger  Trigger finger (stenosing tenosynovitis) is a condition that causes a finger to get stuck in a bent position. Each finger has a tough, cord-like tissue that connects muscle to bone (tendon), and each tendon is surrounded by a tunnel of tissue (tendon sheath). To move your finger, your tendon needs to slide freely through the sheath. Trigger finger happens when the tendon or the sheath thickens, making it difficult to move your finger. Trigger finger can affect any finger or a thumb. It may affect more than one finger. Mild cases may clear up with rest and medicine. Severe cases require more treatment. What are the causes? Trigger finger is caused by a thickened finger tendon or tendon sheath. The cause of this thickening is not known. What increases the risk? The following factors may make you more likely to develop this condition:  Doing activities that require a strong grip.  Having rheumatoid arthritis, gout, or diabetes.  Being 61-73 years old.  Being a woman. What are the signs or symptoms? Symptoms of this condition include:  Pain when bending or straightening your finger.  Tenderness or swelling where your finger attaches to the palm of your hand.  A lump in the palm of your hand or on the inside of your finger.  Hearing a popping sound when you try to straighten your finger.  Feeling a popping, catching, or locking sensation when you try to straighten your finger.  Being unable to straighten your finger. How is this diagnosed? This condition is diagnosed based on your symptoms and a physical exam. How is this treated? This condition may be treated by:  Resting your finger and avoiding activities that make symptoms worse.  Wearing a finger splint to keep your finger in a slightly bent position.  Taking NSAIDs to relieve pain and swelling.  Injecting medicine (steroids) into the tendon sheath to reduce swelling and irritation. Injections may need to be  repeated.  Having surgery to open the tendon sheath. This may be done if other treatments do not work and you cannot straighten your finger. You may need physical therapy after surgery. Follow these instructions at home:   Use moist heat to help reduce pain and swelling as told by your health care provider.  Rest your finger and avoid activities that make pain worse. Return to normal activities as told by your health care provider.  If you have a splint, wear it as told by your health care provider.  Take over-the-counter and prescription medicines only as told by your health care provider.  Keep all follow-up visits as told by your health care provider. This is important. Contact a health care provider if:  Your symptoms are not improving with home care. Summary  Trigger finger (stenosing tenosynovitis) causes your finger to get stuck in a bent position, and it can make it difficult and painful to straighten your finger.  This condition develops when a finger tendon or tendon sheath thickens.  Treatment starts with resting, wearing a splint, and taking NSAIDs.  In severe cases, surgery to open the tendon sheath may be needed. This information is not intended to replace advice given to you by your health care provider. Make sure you discuss any questions you have with your health care provider. Document Released: 03/27/2004 Document Revised: 05/18/2016 Document Reviewed: 05/18/2016 Elsevier Interactive Patient Education  2019 [employer]. Managing Your Hypertension Hypertension is commonly called high blood pressure. This is when the force of your blood pressing against the walls of  your arteries is too strong. Arteries are blood vessels that carry blood from your heart throughout your body. Hypertension forces the heart to work harder to pump blood, and may cause the arteries to become narrow or stiff. Having untreated or uncontrolled hypertension can cause heart attack, stroke,  kidney disease, and other problems. What are blood pressure readings? A blood pressure reading consists of a higher number over a lower number. Ideally, your blood pressure should be below 120/80. The first ("top") number is called the systolic pressure. It is a measure of the pressure in your arteries as your heart beats. The second ("bottom") number is called the diastolic pressure. It is a measure of the pressure in your arteries as the heart relaxes. What does my blood pressure reading mean? Blood pressure is classified into four stages. Based on your blood pressure reading, your health care provider may use the following stages to determine what type of treatment you need, if any. Systolic pressure and diastolic pressure are measured in a unit called mm Hg. Normal  Systolic pressure: below 616.  Diastolic pressure: below 80. Elevated  Systolic pressure: 073-710.  Diastolic pressure: below 80. Hypertension stage 1  Systolic pressure: 626-948.  Diastolic pressure: 54-62. Hypertension stage 2  Systolic pressure: 703 or above.  Diastolic pressure: 90 or above. What health risks are associated with hypertension? Managing your hypertension is an important responsibility. Uncontrolled hypertension can lead to:  A heart attack.  A stroke.  A weakened blood vessel (aneurysm).  Heart failure.  Kidney damage.  Eye damage.  Metabolic syndrome.  Memory and concentration problems. What changes can I make to manage my hypertension? Hypertension can be managed by making lifestyle changes and possibly by taking medicines. Your health care provider will help you make a plan to bring your blood pressure within a normal range. Eating and drinking   Eat a diet that is high in fiber and potassium, and low in salt (sodium), added sugar, and fat. An example eating plan is called the DASH (Dietary Approaches to Stop Hypertension) diet. To eat this way: ? Eat plenty of fresh fruits and  vegetables. Try to fill half of your plate at each meal with fruits and vegetables. ? Eat whole grains, such as whole wheat pasta, brown rice, or whole grain bread. Fill about one quarter of your plate with whole grains. ? Eat low-fat diary products. ? Avoid fatty cuts of meat, processed or cured meats, and poultry with skin. Fill about one quarter of your plate with lean proteins such as fish, chicken without skin, beans, eggs, and tofu. ? Avoid premade and processed foods. These tend to be higher in sodium, added sugar, and fat.  Reduce your daily sodium intake. Most people with hypertension should eat less than 1,500 mg of sodium a day.  Limit alcohol intake to no more than 1 drink a day for nonpregnant women and 2 drinks a day for men. One drink equals 12 oz of beer, 5 oz of wine, or 1 oz of hard liquor. Lifestyle  Work with your health care provider to maintain a healthy body weight, or to lose weight. Ask what an ideal weight is for you.  Get at least 30 minutes of exercise that causes your heart to beat faster (aerobic exercise) most days of the week. Activities may include walking, swimming, or biking.  Include exercise to strengthen your muscles (resistance exercise), such as weight lifting, as part of your weekly exercise routine. Try to do these types  of exercises for 30 minutes at least 3 days a week.  Do not use any products that contain nicotine or tobacco, such as cigarettes and e-cigarettes. If you need help quitting, ask your health care provider.  Control any long-term (chronic) conditions you have, such as high cholesterol or diabetes. Monitoring  Monitor your blood pressure at home as told by your health care provider. Your personal target blood pressure may vary depending on your medical conditions, your age, and other factors.  Have your blood pressure checked regularly, as often as told by your health care provider. Working with your health care provider  Review all  the medicines you take with your health care provider because there may be side effects or interactions.  Talk with your health care provider about your diet, exercise habits, and other lifestyle factors that may be contributing to hypertension.  Visit your health care provider regularly. Your health care provider can help you create and adjust your plan for managing hypertension. Will I need medicine to control my blood pressure? Your health care provider may prescribe medicine if lifestyle changes are not enough to get your blood pressure under control, and if:  Your systolic blood pressure is 130 or higher.  Your diastolic blood pressure is 80 or higher. Take medicines only as told by your health care provider. Follow the directions carefully. Blood pressure medicines must be taken as prescribed. The medicine does not work as well when you skip doses. Skipping doses also puts you at risk for problems. Contact a health care provider if:  You think you are having a reaction to medicines you have taken.  You have repeated (recurrent) headaches.  You feel dizzy.  You have swelling in your ankles.  You have trouble with your vision. Get help right away if:  You develop a severe headache or confusion.  You have unusual weakness or numbness, or you feel faint.  You have severe pain in your chest or abdomen.  You vomit repeatedly.  You have trouble breathing. Summary  Hypertension is when the force of blood pumping through your arteries is too strong. If this condition is not controlled, it may put you at risk for serious complications.  Your personal target blood pressure may vary depending on your medical conditions, your age, and other factors. For most people, a normal blood pressure is less than 120/80.  Hypertension is managed by lifestyle changes, medicines, or both. Lifestyle changes include weight loss, eating a healthy, low-sodium diet, exercising more, and limiting  alcohol. This information is not intended to replace advice given to you by your health care provider. Make sure you discuss any questions you have with your health care provider. Document Released: 03/01/2012 Document Revised: 05/05/2016 Document Reviewed: 05/05/2016 Elsevier Interactive Patient Education  2019 [employer] Ankle Pain The ankle joint holds your body weight and allows you to move around. Ankle pain can occur on either side or the back of one ankle or both ankles. Ankle pain may be sharp and burning or dull and aching. There may be tenderness, stiffness, redness, or warmth around the ankle. Many things can cause ankle pain, including an injury to the area and overuse of the ankle. Follow these instructions at home: Activity  Rest your ankle as told by your health care provider. Avoid any activities that cause ankle pain.  Do not use the injured limb to support your body weight until your health care provider says that you can. Use crutches as told by your  health care provider.  Do exercises as told by your health care provider.  Ask your health care provider when it is safe to drive if you have a brace on your ankle. If you have a brace:  Wear the brace as told by your health care provider. Remove it only as told by your health care provider.  Loosen the brace if your toes tingle, become numb, or turn cold and blue.  Keep the brace clean.  If the brace is not waterproof: ? Do not let it get wet. ? Cover it with a watertight covering when you take a bath or shower. If you were given an elastic bandage:   Remove it when you take a bath or a shower.  Try not to move your ankle very much, but wiggle your toes from time to time. This helps to prevent swelling.  Adjust the bandage to make it more comfortable if it feels too tight.  Loosen the bandage if you have numbness or tingling in your foot or if your foot turns cold and blue. Managing pain, stiffness, and  swelling   If directed, put ice on the painful area. ? If you have a removable brace or elastic bandage, remove it as told by your health care provider. ? Put ice in a plastic bag. ? Place a towel between your skin and the bag. ? Leave the ice on for 20 minutes, 2-3 times a day.  Move your toes often to avoid stiffness and to lessen swelling.  Raise (elevate) your ankle above the level of your heart while you are sitting or lying down. General instructions  Record information about your pain. Writing down the following may be helpful for you and your health care provider: ? How often you have ankle pain. ? Where the pain is located. ? What the pain feels like.  If treatment involves wearing a prescribed shoe or insole, make sure you wear it correctly and for as long as told by your health care provider.  Take over-the-counter and prescription medicines only as told by your health care provider.  Keep all follow-up visits as told by your health care provider. This is important. Contact a health care provider if:  Your pain gets worse.  Your pain is not relieved with medicines.  You have a fever or chills.  You are having more trouble with walking.  You have new symptoms. Get help right away if:  Your foot, leg, toes, or ankle: ? Tingles or becomes numb. ? Becomes swollen. ? Turns pale or blue. Summary  Ankle pain can occur on either side or the back of one ankle or both ankles.  Ankle pain may be sharp and burning or dull and aching.  Rest your ankle as told by your health care provider. If told, apply ice to the area.  Take over-the-counter and prescription medicines only as told by your health care provider. This information is not intended to replace advice given to you by your health care provider. Make sure you discuss any questions you have with your health care provider. Document Released: 11/25/2009 Document Revised: 12/14/2017 Document Reviewed: 12/14/2017  Elsevier Interactive Patient Education  2019 [employer].

## 2018-09-28 NOTE — Progress Notes (Signed)
Subjective:    Patient ID: Cassie Mills, female    DOB: 08-20-1975, 43 y.o.   MRN: [id number]  42y/o African-American established female pt presenting for HTN f/u. Has been taking meds daily but did drop the Hydrochlorothiazide dosing down to 12.5mg  daily instead of 2 tabs (25mg ) as she was running low and her PCM appt was cancelled now scheduled for 21 May due to covid pandemic. BP remains elevated today but improved from previous. Pt endorses intermittent palpitations but less frequent than previous. Denies HA, vision changes, chest pain.  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  Tylenol and biofreeze gel prn at home and has splint/reusable ice pack.  Patient has also noticed right ankle hurting and sometimes end of day left foot swelling slightly.  Foot swelling resolves with elevation and sleeping does not occur every day.  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  In 2017 was on amlodipine and lisinopril 10mg  to manage her BP when she changed PCMs 2018 lisinopril and amlodipine stopped and started on hydrochlorothiazide as medication on their formulary.     Review of Systems  Constitutional: Negative for activity change, appetite change, chills, diaphoresis, fatigue and fever.  HENT: Negative for trouble swallowing and voice change.   Eyes: Negative for photophobia and visual disturbance.  Respiratory: Negative for cough, chest tightness, shortness of breath, wheezing and stridor.   Cardiovascular: Positive for palpitations and leg swelling. Negative for chest pain.  Gastrointestinal: Negative for diarrhea, nausea and vomiting.  Endocrine: Negative for cold intolerance and heat intolerance.  Genitourinary: Negative for difficulty urinating.  Musculoskeletal: Positive for myalgias. Negative for back pain, gait problem, joint swelling, neck pain and neck  stiffness.  Skin: Negative for color change, pallor, rash and wound.  Allergic/Immunologic: Negative for food allergies.  Neurological: Negative for dizziness, tremors, seizures, syncope, facial asymmetry, speech difficulty, weakness, light-headedness, numbness and headaches.  Hematological: Negative for adenopathy. Does not bruise/bleed easily.  Psychiatric/Behavioral: Negative for agitation, confusion and sleep disturbance.       Objective:   Physical Exam Vitals signs and nursing note reviewed.  Constitutional:      General: She is awake. She is not in acute distress.    Appearance: Normal appearance. She is well-developed and well-groomed. She is obese. She is not ill-appearing, toxic-appearing or diaphoretic.  HENT:     Head: Normocephalic and atraumatic.     Jaw: There is normal jaw occlusion.     Salivary Glands: Right salivary gland is not diffusely enlarged or tender. Left salivary gland is not diffusely enlarged or tender.     Right Ear: Hearing and external ear normal.     Left Ear: Hearing and external ear normal.     Nose: Nose normal.     Mouth/Throat:     Lips: Pink. No lesions.     Mouth: Mucous membranes are moist. No oral lesions.     Tongue: No lesions.     Pharynx: Uvula midline. No pharyngeal swelling, oropharyngeal exudate, posterior oropharyngeal erythema or uvula swelling.     Tonsils: No tonsillar exudate. 0 on the right. 0 on the left.  Eyes:     General: Lids are normal. No visual field deficit or scleral icterus.       Right eye: No discharge.        Left eye: No discharge.     Extraocular Movements: Extraocular  movements intact.     Conjunctiva/sclera: Conjunctivae normal.     Pupils: Pupils are equal, round, and reactive to light.  Neck:     Musculoskeletal: Normal range of motion and neck supple. No neck rigidity or muscular tenderness.     Trachea: Trachea and phonation normal.  Cardiovascular:     Rate and Rhythm: Normal rate.     Pulses:  Normal pulses.          Radial pulses are 2+ on the right side and 2+ on the left side.     Heart sounds: Normal heart sounds, S1 normal and S2 normal. Heart sounds not distant. No murmur.  Pulmonary:     Effort: Pulmonary effort is normal. No respiratory distress.     Breath sounds: Normal breath sounds and air entry. No stridor, decreased air movement or transmitted upper airway sounds. No decreased breath sounds, wheezing, rhonchi or rales.     Comments: No cough observed in exam room; spoke full sentences without difficulty Musculoskeletal:        General: No swelling or deformity.     Right shoulder: Normal.     Left shoulder: Normal.     Right elbow: Normal.    Left elbow: Normal.     Right hip: Normal.     Left hip: Normal.     Right knee: Normal.     Left knee: Normal.     Right ankle: Normal.     Left ankle: Normal.     Cervical back: Normal.     Right forearm: Normal.     Left forearm: Normal.     Right hand: Normal.     Left hand: Normal.     Right lower leg: No edema.     Left lower leg: No edema.  Lymphadenopathy:     Head:     Right side of head: No submental, submandibular, preauricular, posterior auricular or occipital adenopathy.     Left side of head: No submental, submandibular, preauricular, posterior auricular or occipital adenopathy.     Cervical: No cervical adenopathy.     Right cervical: No superficial or posterior cervical adenopathy.    Left cervical: No superficial or posterior cervical adenopathy.  Skin:    General: Skin is warm and dry.     Capillary Refill: Capillary refill takes less than 2 seconds.     Coloration: Skin is not ashen, cyanotic, jaundiced, mottled, pale or sallow.     Findings: No abrasion, acne, bruising, burn, ecchymosis, erythema, laceration, lesion, petechiae, rash or wound.     Nails: There is no clubbing.   Neurological:     General: No focal deficit present.     Mental Status: She is alert and oriented to person, place,  and time. Mental status is at baseline.     GCS: GCS eye subscore is 4. GCS verbal subscore is 5. GCS motor subscore is 6.     Cranial Nerves: Cranial nerves are intact. No cranial nerve deficit, dysarthria or facial asymmetry.     Sensory: Sensation is intact. No sensory deficit.     Motor: Motor function is intact. No tremor, atrophy, abnormal muscle tone or seizure activity.     Coordination: Coordination is intact. Coordination normal.     Gait: Gait is intact. Gait normal.     Comments: Gait sure steady and smooth in hallway; no limp; in/out of chair without difficulty; upper and lower extremity strength 5/5 equal bilaterally  Psychiatric:  Attention and Perception: Attention and perception normal.        Mood and Affect: Mood and affect normal.        Speech: Speech normal.        Behavior: Behavior normal. Behavior is cooperative.        Thought Content: Thought content normal.        Cognition and Memory: Cognition and memory normal.        Judgment: Judgment normal.           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  Encouraged patient to restart wearing finger splint while at work for support.  Tylenol 1000mg  po QID prn pain. Once blood pressure controlled can consider adding motrin as renal and liver function normalConsider orthopedics consult if no improvement with conservative care.  Start cryothreapy 15 minutes TID prn and biofreeze gel topical QID prn pain at home.  Exitcare handout trigger finger.  Patient verbalized understanding information/instructions, agreed with plan of care and had no further questions at this time.  Patient standing entire shift now at work.  Ensure supportive footwear and if treads worn replace shoes.  Consider supportive  elastic socks knee or calf high.  Elevate and ice ankle at home after work.  Consider weight loss.  Tylenol 1000mg  po QID prn pain and/or biofreeze gel topical QID prn pain.  Call or return to clinic as needed if these symptoms worsen or fail to improve as anticipated. Exitcare handout acute ankle pain.Patient verbalized agreement and understanding of treatment plan and had no further questions at this time.   exitcare handout managing hypertension. She had scheduled follow up with PCM 13 Sep 2018 but it was cancelled due to covid pandemic office closed and she was rescheduled for 21 May.  BP   Renal and liver function and electrolytes normal on recent labs.  Adding amlodipine 5mg  po daily today and patient will return this afternoon for repeat blood pressure check.  If still greater than 200/100 will have patient take second 5mg  po amlodipine as dispensed from [facility] to patient today.  30 Mar 180/98 had forgotten to take medications.  RN Hildred Alamin had tried contacting patient at work but no email and was not in assigned Programme researcher, broadcasting/film[employer]video [place] inventory.  Today I spoke with dept mgr and was notified patient flexing to shipping and patient came for follow up in clinic.  Patient only has had hydrochlorothiazide 12.5mg  this week as she was worried was going to run out prior to her follow up PCM appt had been taking 12.5mg  po BID since 24 Aug 2018.  Reiterated with patient she can come to see me for her high blood pressure and we have medications in clinic for use/dispensing.  Notify us if she develops persistent cough after starting lisinopril 10mg  po daily #90 RF0 dispensed from [facility] today to patient.  continue hydrochlorothiazide 25mg  po qam #90 RF0.  Patient has been splitting her amlodipine 5mg  po BID #180 RF0 as feels it works better for her that way.  Refilled today Discussed with patient the three medications have different methods of lowering blood pressure and continue taking all of them.  She is to follow up with RN  Hildred Alamin tomorrow for BP check and neck week and see me in 1 week for re-evaluation. She is to notify us if  worsening palpitations, new cough, headache, dizziness, chest pain, nausea or vomiting or fatigue.  Patient is very active at work lifting repetitively and walking.  Encouraged her to continue active lifestyle and healthy diet choices. Patient verbalized understanding information/instructions, agreed with plan of care and had no further questions at this time.

## 2018-10-03 ENCOUNTER — Other Ambulatory Visit: Payer: Self-pay

## 2018-10-03 ENCOUNTER — Ambulatory Visit: Payer: Self-pay | Admitting: *Deleted

## 2018-10-03 VITALS — BP 178/96

## 2018-10-03 DIAGNOSIS — I10 Essential (primary) hypertension: Secondary | ICD-10-CM

## 2018-10-03 NOTE — Progress Notes (Signed)
Pt in for BP check. Elevated today. She does not think she took meds this morning. New schedule coming in at 0600 has been difficult for her to transition to and she forgot them this morning. Did take meds last night. Has not yet started Lisinopril as she wasn't sure what time of day she was supposed to be taking it. RN printed med list with administration instructions and wrote down times of day. Suggested starting Lisinopril this evening to continue dosing in evenings as reviewed during previous appt. Pt is agreeable to this.  Also sent MyChart text sign up to pt's personal cell at her request.  Next f/u NP appt made for 4/16 at 11:30.

## 2018-10-05 ENCOUNTER — Other Ambulatory Visit: Payer: Self-pay

## 2018-10-05 ENCOUNTER — Ambulatory Visit: Payer: Self-pay | Admitting: *Deleted

## 2018-10-05 VITALS — BP 141/98 | HR 75

## 2018-10-05 DIAGNOSIS — I10 Essential (primary) hypertension: Secondary | ICD-10-CM

## 2018-10-05 NOTE — Progress Notes (Signed)
HCTZ and Amlodipine this am. Lisinopril and Amlodipine last night. This was her first dose of Lisinopril.  Feels very tired but otherwise fine. Feels it is likely related to new med and BP coming down some.

## 2018-10-12 ENCOUNTER — Ambulatory Visit: Payer: Self-pay | Admitting: Registered Nurse

## 2018-10-12 ENCOUNTER — Other Ambulatory Visit: Payer: Self-pay

## 2018-10-12 ENCOUNTER — Encounter: Payer: Self-pay | Admitting: Registered Nurse

## 2018-10-12 VITALS — BP 146/94 | HR 78

## 2018-10-12 DIAGNOSIS — M65341 Trigger finger, right ring finger: Secondary | ICD-10-CM

## 2018-10-12 DIAGNOSIS — I10 Essential (primary) hypertension: Secondary | ICD-10-CM

## 2018-10-12 NOTE — Progress Notes (Signed)
Subjective:    Patient ID: Cassie Mills, female    DOB: 07/27/1975, 43 y.o.   MRN: [id number]  42y/o african [place] female established patient here for follow up right trigger finger and blood pressure.  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  Finger has been hurting again since moved to packing area during covid 19 pandemic.  Wearing splint prn and icing.  Hasn't taken NSAIDS as she knows this can increase her blood pressure.  Does not have any tylenol at home.  Still using biofreeze also.  Patient report fatigue has resolved and feeling back to normal.  Left foot/ankle swelling has also decreased with her taking medications daily.  Amlodipine BID, lisinopril qhs and hydrochlorothiazide qam.  Denied palpitations, SOB, dyspnea, chest pain, headache, dizzyness.  Ankle pain has resolved     Review of Systems  Constitutional: Negative for activity change, appetite change, chills, diaphoresis, fatigue and fever.  HENT: Negative for trouble swallowing and voice change.   Eyes: Negative for photophobia and visual disturbance.  Respiratory: Negative for cough, shortness of breath, wheezing and stridor.   Cardiovascular: Negative for chest pain.  Gastrointestinal: Negative for diarrhea, nausea and vomiting.  Endocrine: Negative for cold intolerance and heat intolerance.  Genitourinary: Negative for difficulty urinating.  Musculoskeletal: Positive for myalgias. Negative for arthralgias, back pain, gait problem, joint swelling, neck pain and neck stiffness.  Skin: Negative for color change, pallor, rash and wound.  Allergic/Immunologic: Negative for food allergies.  Neurological: Negative for dizziness, tremors, seizures, syncope, facial asymmetry, speech difficulty, weakness, light-headedness, numbness and headaches.  Hematological: Negative for adenopathy. Does not bruise/bleed easily.  Psychiatric/Behavioral: Negative for agitation, confusion and sleep  disturbance.       Objective:   Physical Exam Vitals signs and nursing note reviewed.  Constitutional:      General: She is awake. She is not in acute distress.    Appearance: Normal appearance. She is well-developed and well-groomed. She is obese. She is not ill-appearing, toxic-appearing or diaphoretic.  HENT:     Head: Normocephalic and atraumatic.     Jaw: There is normal jaw occlusion.     Right Ear: External ear normal.     Left Ear: External ear normal.     Nose: Nose normal.     Mouth/Throat:     Mouth: Mucous membranes are moist.     Pharynx: No oropharyngeal exudate or posterior oropharyngeal erythema.  Eyes:     General: Lids are normal. No visual field deficit.    Extraocular Movements: Extraocular movements intact.     Conjunctiva/sclera: Conjunctivae normal.     Pupils: Pupils are equal, round, and reactive to light.  Neck:     Musculoskeletal: Normal range of motion and neck supple. No neck rigidity or muscular tenderness.     Trachea: Trachea normal.  Cardiovascular:     Rate and Rhythm: Normal rate and regular rhythm.     Pulses: Normal pulses.          Radial pulses are 2+ on the right side and 2+ on the left side.     Heart sounds: Normal heart sounds. No murmur. No friction rub. No gallop.   Pulmonary:     Effort: Pulmonary effort is normal. No respiratory distress.     Breath sounds: Normal breath sounds and air entry. No stridor, decreased air movement or transmitted upper airway sounds. No decreased breath sounds, wheezing, rhonchi or rales.  Comments: No cough observed in exam room; spoke full sentences without difficulty Chest:     Chest wall: No tenderness.  Abdominal:     Palpations: Abdomen is soft.  Musculoskeletal:        General: Deformity present. No swelling, tenderness or signs of injury.     Right shoulder: Normal.     Left shoulder: Normal.     Right elbow: Normal.    Left elbow: Normal.     Right wrist: Normal.     Right hip:  Normal.     Left hip: Normal.     Right knee: Normal.     Left knee: Normal.     Right ankle: Normal.     Left ankle: Normal.     Cervical back: Normal.     Thoracic back: Normal.     Lumbar back: Normal.     Right forearm: Normal.     Left forearm: Normal.     Right hand: She exhibits decreased range of motion. She exhibits no tenderness, no bony tenderness, normal two-point discrimination, normal capillary refill, no deformity, no laceration and no swelling. Normal sensation noted. Normal strength noted. She exhibits no finger abduction, no thumb/finger opposition and no wrist extension trouble.     Left hand: Normal.       Hands:     Right lower leg: No edema.     Left lower leg: No edema.     Comments: PIP joint flexed 10 degrees at rest right 4th digit; no increased temperature discomfort with attempting full extension AROM or by provider; no crepitus noted with arom; not sticking or giving out or swelling  Lymphadenopathy:     Head:     Right side of head: No submental, submandibular, tonsillar, preauricular, posterior auricular or occipital adenopathy.     Left side of head: No submental, submandibular, tonsillar, preauricular, posterior auricular or occipital adenopathy.     Cervical: No cervical adenopathy.     Right cervical: No superficial cervical adenopathy.    Left cervical: No superficial cervical adenopathy.  Skin:    General: Skin is warm and dry.     Capillary Refill: Capillary refill takes less than 2 seconds.     Coloration: Skin is not ashen, cyanotic, jaundiced, mottled, pale or sallow.     Findings: No abrasion, abscess, acne, bruising, burn, ecchymosis, erythema, signs of injury, laceration, lesion, petechiae, rash or wound.     Nails: There is no clubbing.   Neurological:     General: No focal deficit present.     Mental Status: She is alert and oriented to person, place, and time. Mental status is at baseline.     GCS: GCS eye subscore is 4. GCS verbal  subscore is 5. GCS motor subscore is 6.     Cranial Nerves: Cranial nerves are intact. No cranial nerve deficit, dysarthria or facial asymmetry.     Sensory: Sensation is intact. No sensory deficit.     Motor: Motor function is intact. No weakness, tremor, atrophy, abnormal muscle tone or seizure activity.     Coordination: Coordination is intact. Coordination normal.     Gait: Gait is intact. Gait normal.     Comments: In/out of chair without difficulty; gait sure and steady in hallway; bilateral upper and lower extremities strength equal 5/5  Psychiatric:        Attention and Perception: Attention and perception normal.        Mood and Affect: Mood and affect normal.  Speech: Speech normal.        Behavior: Behavior normal. Behavior is cooperative.        Thought Content: Thought content normal.        Cognition and Memory: Cognition and memory normal.        Judgment: Judgment normal.           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  Tylenol 1000mg  po QID prn pain given 8 UD from clinic stock may see RN Hildred Alamin for refills. Once blood pressure controlled can consider adding motrin as renal and liver function normalConsider orthopedics consult if no improvement with conservative care.  Start cryothreapy 15 minutes TID prn and biofreeze gel topical QID prn pain at home.  Exitcare handout trigger finger.  Patient verbalized understanding information/instructions, agreed with plan of care and had no further questions at this time.  exitcare handout managing hypertension.continue amlodipine, hydrochlorothiazide and lisinopril 10mg  po daily  previously dispensed from [facility] to patient  See RN Hildred Alamin next week for BP recheck and avoid caffeine prior to seeing RN Hildred Alamin at work.  May drink caffeine with breakfast at home; hold mountain dew on break until after BP recheck with RN Hildred Alamin.  If BP still above 140/90 next week will increase lisinopril to 20mg  po daily.  She is to notify us if worsening palpitations, new cough, headache, dizziness, chest pain, nausea or vomiting or fatigue.  Encouraged her to continue active lifestyle and healthy diet choices. Patient verbalized understanding information/instructions, agreed with plan of care and had no further questions at this time.

## 2018-10-12 NOTE — Patient Instructions (Signed)
Managing Your Hypertension Hypertension is commonly called high blood pressure. This is when the force of your blood pressing against the walls of your arteries is too strong. Arteries are blood vessels that carry blood from your heart throughout your body. Hypertension forces the heart to work harder to pump blood, and may cause the arteries to become narrow or stiff. Having untreated or uncontrolled hypertension can cause heart attack, stroke, kidney disease, and other problems. What are blood pressure readings? A blood pressure reading consists of a higher number over a lower number. Ideally, your blood pressure should be below 120/80. The first ("top") number is called the systolic pressure. It is a measure of the pressure in your arteries as your heart beats. The second ("bottom") number is called the diastolic pressure. It is a measure of the pressure in your arteries as the heart relaxes. What does my blood pressure reading mean? Blood pressure is classified into four stages. Based on your blood pressure reading, your health care provider may use the following stages to determine what type of treatment you need, if any. Systolic pressure and diastolic pressure are measured in a unit called mm Hg. Normal  Systolic pressure: below 784.  Diastolic pressure: below 80. Elevated  Systolic pressure: 696-295.  Diastolic pressure: below 80. Hypertension stage 1  Systolic pressure: 284-132.  Diastolic pressure: 44-01. Hypertension stage 2  Systolic pressure: 027 or above.  Diastolic pressure: 90 or above. What health risks are associated with hypertension? Managing your hypertension is an important responsibility. Uncontrolled hypertension can lead to:  A heart attack.  A stroke.  A weakened blood vessel (aneurysm).  Heart failure.  Kidney damage.  Eye damage.  Metabolic syndrome.  Memory and concentration problems. What changes can I make to manage my hypertension?  Hypertension can be managed by making lifestyle changes and possibly by taking medicines. Your health care provider will help you make a plan to bring your blood pressure within a normal range. Eating and drinking   Eat a diet that is high in fiber and potassium, and low in salt (sodium), added sugar, and fat. An example eating plan is called the DASH (Dietary Approaches to Stop Hypertension) diet. To eat this way: ? Eat plenty of fresh fruits and vegetables. Try to fill half of your plate at each meal with fruits and vegetables. ? Eat whole grains, such as whole wheat pasta, brown rice, or whole grain bread. Fill about one quarter of your plate with whole grains. ? Eat low-fat diary products. ? Avoid fatty cuts of meat, processed or cured meats, and poultry with skin. Fill about one quarter of your plate with lean proteins such as fish, chicken without skin, beans, eggs, and tofu. ? Avoid premade and processed foods. These tend to be higher in sodium, added sugar, and fat.  Reduce your daily sodium intake. Most people with hypertension should eat less than 1,500 mg of sodium a day.  Limit alcohol intake to no more than 1 drink a day for nonpregnant women and 2 drinks a day for men. One drink equals 12 oz of beer, 5 oz of wine, or 1 oz of hard liquor. Lifestyle  Work with your health care provider to maintain a healthy body weight, or to lose weight. Ask what an ideal weight is for you.  Get at least 30 minutes of exercise that causes your heart to beat faster (aerobic exercise) most days of the week. Activities may include walking, swimming, or biking.  Include exercise  to strengthen your muscles (resistance exercise), such as weight lifting, as part of your weekly exercise routine. Try to do these types of exercises for 30 minutes at least 3 days a week.  Do not use any products that contain nicotine or tobacco, such as cigarettes and e-cigarettes. If you need help quitting, ask your health  care provider.  Control any long-term (chronic) conditions you have, such as high cholesterol or diabetes. Monitoring  Monitor your blood pressure at home as told by your health care provider. Your personal target blood pressure may vary depending on your medical conditions, your age, and other factors.  Have your blood pressure checked regularly, as often as told by your health care provider. Working with your health care provider  Review all the medicines you take with your health care provider because there may be side effects or interactions.  Talk with your health care provider about your diet, exercise habits, and other lifestyle factors that may be contributing to hypertension.  Visit your health care provider regularly. Your health care provider can help you create and adjust your plan for managing hypertension. Will I need medicine to control my blood pressure? Your health care provider may prescribe medicine if lifestyle changes are not enough to get your blood pressure under control, and if:  Your systolic blood pressure is 130 or higher.  Your diastolic blood pressure is 80 or higher. Take medicines only as told by your health care provider. Follow the directions carefully. Blood pressure medicines must be taken as prescribed. The medicine does not work as well when you skip doses. Skipping doses also puts you at risk for problems. Contact a health care provider if:  You think you are having a reaction to medicines you have taken.  You have repeated (recurrent) headaches.  You feel dizzy.  You have swelling in your ankles.  You have trouble with your vision. Get help right away if:  You develop a severe headache or confusion.  You have unusual weakness or numbness, or you feel faint.  You have severe pain in your chest or abdomen.  You vomit repeatedly.  You have trouble breathing. Summary  Hypertension is when the force of blood pumping through your arteries  is too strong. If this condition is not controlled, it may put you at risk for serious complications.  Your personal target blood pressure may vary depending on your medical conditions, your age, and other factors. For most people, a normal blood pressure is less than 120/80.  Hypertension is managed by lifestyle changes, medicines, or both. Lifestyle changes include weight loss, eating a healthy, low-sodium diet, exercising more, and limiting alcohol. This information is not intended to replace advice given to you by your health care provider. Make sure you discuss any questions you have with your health care provider. Document Released: 03/01/2012 Document Revised: 05/05/2016 Document Reviewed: 05/05/2016 Elsevier Interactive Patient Education  2019 [employer]. Trigger Finger  Trigger finger (stenosing tenosynovitis) is a condition that causes a finger to get stuck in a bent position. Each finger has a tough, cord-like tissue that connects muscle to bone (tendon), and each tendon is surrounded by a tunnel of tissue (tendon sheath). To move your finger, your tendon needs to slide freely through the sheath. Trigger finger happens when the tendon or the sheath thickens, making it difficult to move your finger. Trigger finger can affect any finger or a thumb. It may affect more than one finger. Mild cases may clear up with rest  and medicine. Severe cases require more treatment. What are the causes? Trigger finger is caused by a thickened finger tendon or tendon sheath. The cause of this thickening is not known. What increases the risk? The following factors may make you more likely to develop this condition:  Doing activities that require a strong grip.  Having rheumatoid arthritis, gout, or diabetes.  Being 26-42 years old.  Being a woman. What are the signs or symptoms? Symptoms of this condition include:  Pain when bending or straightening your finger.  Tenderness or swelling where  your finger attaches to the palm of your hand.  A lump in the palm of your hand or on the inside of your finger.  Hearing a popping sound when you try to straighten your finger.  Feeling a popping, catching, or locking sensation when you try to straighten your finger.  Being unable to straighten your finger. How is this diagnosed? This condition is diagnosed based on your symptoms and a physical exam. How is this treated? This condition may be treated by:  Resting your finger and avoiding activities that make symptoms worse.  Wearing a finger splint to keep your finger in a slightly bent position.  Taking NSAIDs to relieve pain and swelling.  Injecting medicine (steroids) into the tendon sheath to reduce swelling and irritation. Injections may need to be repeated.  Having surgery to open the tendon sheath. This may be done if other treatments do not work and you cannot straighten your finger. You may need physical therapy after surgery. Follow these instructions at home:   Use moist heat to help reduce pain and swelling as told by your health care provider.  Rest your finger and avoid activities that make pain worse. Return to normal activities as told by your health care provider.  If you have a splint, wear it as told by your health care provider.  Take over-the-counter and prescription medicines only as told by your health care provider.  Keep all follow-up visits as told by your health care provider. This is important. Contact a health care provider if:  Your symptoms are not improving with home care. Summary  Trigger finger (stenosing tenosynovitis) causes your finger to get stuck in a bent position, and it can make it difficult and painful to straighten your finger.  This condition develops when a finger tendon or tendon sheath thickens.  Treatment starts with resting, wearing a splint, and taking NSAIDs.  In severe cases, surgery to open the tendon sheath may be  needed. This information is not intended to replace advice given to you by your health care provider. Make sure you discuss any questions you have with your health care provider. Document Released: 03/27/2004 Document Revised: 05/18/2016 Document Reviewed: 05/18/2016 Elsevier Interactive Patient Education  2019 [employer].

## 2018-10-19 ENCOUNTER — Ambulatory Visit: Payer: Self-pay | Admitting: *Deleted

## 2018-10-19 ENCOUNTER — Other Ambulatory Visit: Payer: Self-pay

## 2018-10-19 VITALS — BP 141/90 | HR 49

## 2018-10-19 DIAGNOSIS — I10 Essential (primary) hypertension: Secondary | ICD-10-CM

## 2018-10-19 NOTE — Progress Notes (Signed)
BP check. Pt feels well today. Denies adverse effects. No complaints. No meds this morning. Did take last night.

## 2018-10-24 ENCOUNTER — Telehealth: Payer: Self-pay | Admitting: Registered Nurse

## 2018-10-24 ENCOUNTER — Encounter: Payer: Self-pay | Admitting: Registered Nurse

## 2018-10-24 NOTE — Telephone Encounter (Signed)
Patient reports feeling well.  Forgot to take her blood pressure medications this am.  Last doses yesterday am and pm.  Patient thinks she may have medicine with her in her purse  Will check now and taker her am blood pressure medication amlodipine and hydrochlorothiazide and then follow up with RN Hildred Alamin for BP check after lunch.  If she does not have medications with her she will reschedule her BP check for Thursday.  Reinforced with patient to take her medications every day and continue weekly blood pressure checks with RN Hildred Alamin.  Patient verbalized understanding information/instructions, agreed with plan of care and had no further questions at this time.

## 2018-10-26 ENCOUNTER — Other Ambulatory Visit: Payer: Self-pay

## 2018-10-26 ENCOUNTER — Ambulatory Visit: Payer: Self-pay | Admitting: *Deleted

## 2018-10-26 VITALS — BP 140/95 | HR 78

## 2018-10-26 DIAGNOSIS — I10 Essential (primary) hypertension: Secondary | ICD-10-CM

## 2018-10-26 NOTE — Telephone Encounter (Signed)
Pt seen 10/26/18 in clinic for BP check. See RN encounter.

## 2018-10-26 NOTE — Progress Notes (Signed)
Morning meds on board today. Discussed changing routines in the morning to help her remember to take them daily. She is agreeable to this and may try placing them on the nightstand with a bottle of water to take every morning. Does not typically miss doses of evening meds. Pt is having palpitations today but believes this is related to having had caffeine at lunch. Palpitations didn't start until after lunch and she has not had any since starting BP meds. Denies any other symptoms. Does endorse increased fatigue r/t earlier schedule, 6a-2p M-F. Still trying to adjust to this.

## 2018-10-27 NOTE — Telephone Encounter (Signed)
noted 

## 2018-11-02 ENCOUNTER — Other Ambulatory Visit: Payer: Self-pay

## 2018-11-02 ENCOUNTER — Ambulatory Visit: Payer: Self-pay | Admitting: *Deleted

## 2018-11-02 VITALS — BP 146/96

## 2018-11-02 DIAGNOSIS — I10 Essential (primary) hypertension: Secondary | ICD-10-CM

## 2018-11-02 MED ORDER — LISINOPRIL 10 MG PO TABS: 20.0000 mg | ORAL_TABLET | Freq: Every day | ORAL | 0 refills | Status: DC

## 2018-11-02 NOTE — Progress Notes (Signed)
Pt instructed per verbal order from NP Betancourt to increase Lisinopril dose to 2 tabs once daily, so from 10mg  up to 20mg . Pt had #90 bottle filled in April. Pt takes this at night, so will plan to take first doubled dose this evening. With this she will have one more month supply. Plan to return to clinic Tues 5/19 for BP recheck. Pt verbalizes understanding and agreement with plan of care. No further questions/concerns.

## 2018-11-02 NOTE — Progress Notes (Signed)
Patient will follow up in 1 week for repeat re-evaluation/BP check after increasing her lisinopril to 20mg  po daily.  Discussed with patient increased temperatures in warehouse during summer typically will not lower but increase BP also and currently not at goal on 10mg  lisinopril with amlodipine and hydrochlorothiazide.  Patient verbalized understanding information/instructions, agreed with plan of care and had no further questions at this time.

## 2018-11-02 NOTE — Addendum Note (Signed)
Addended by: Gerarda Fraction A on: 11/02/2018 02:30 PM   Modules accepted: Orders

## 2018-11-07 ENCOUNTER — Telehealth: Payer: Self-pay | Admitting: Registered Nurse

## 2018-11-07 ENCOUNTER — Encounter: Payer: Self-pay | Admitting: Registered Nurse

## 2018-11-07 MED ORDER — LISINOPRIL 10 MG PO TABS: 15.0000 mg | ORAL_TABLET | Freq: Every day | ORAL | 0 refills | Status: DC

## 2018-11-07 NOTE — Telephone Encounter (Signed)
Patient reported she tried 2 days of 20mg  lisinopril this weekend but it made her feel very tired and fatigued so she went back to 10mg  po daily.  Discussed with patient to try 1 1/2 10mg  tabs lisinopril this week and follow up with RN Hildred Alamin next week for BP check  Patient verbalized understanding information/instructions, agreed with plan of care and had no further questions at this time.

## 2018-11-10 ENCOUNTER — Ambulatory Visit: Payer: Self-pay | Admitting: *Deleted

## 2018-11-10 ENCOUNTER — Other Ambulatory Visit: Payer: Self-pay

## 2018-11-10 VITALS — BP 155/103 | HR 69

## 2018-11-10 DIAGNOSIS — I10 Essential (primary) hypertension: Secondary | ICD-10-CM

## 2018-11-10 NOTE — Progress Notes (Signed)
Taking one tab Lisinopril currently. Has not increased back to 1.5 tabs yet. Sts she will plan to do that tonight. No meds on board today. Forgot to take all meds this morning. No palpitations or other HTN sx.

## 2018-11-14 ENCOUNTER — Encounter: Payer: Self-pay | Admitting: Registered Nurse

## 2018-11-14 ENCOUNTER — Telehealth: Payer: Self-pay | Admitting: Registered Nurse

## 2018-11-14 NOTE — Telephone Encounter (Signed)
[facility] today stated she has been taking her amlodipine, hydrochlorothiazide and lisinopril 1/2 tab po daily without problems.  Patient believes she is cutting 20mg  lisinopril tab in half and taking 10mg  daily.  Discussed with patient to come to clinic today and see RN Hildred Alamin for BP recheck and if not at goal 130s/70s will dispense 10mg  lisinopril bottle to patient for her to cut in half so she can take 15mg  lisinopril daily and follow up for repeat blood pressure check later this week.  Patient verbalized understanding information/instructions, agreed with plan of care and had no further questions at this time.

## 2018-11-16 ENCOUNTER — Ambulatory Visit: Payer: Self-pay | Admitting: Registered Nurse

## 2018-11-16 ENCOUNTER — Encounter: Payer: Self-pay | Admitting: Registered Nurse

## 2018-11-16 ENCOUNTER — Other Ambulatory Visit: Payer: Self-pay

## 2018-11-16 VITALS — BP 131/96 | HR 68

## 2018-11-16 DIAGNOSIS — I10 Essential (primary) hypertension: Secondary | ICD-10-CM

## 2018-11-16 DIAGNOSIS — M65341 Trigger finger, right ring finger: Secondary | ICD-10-CM

## 2018-11-16 MED ORDER — IBUPROFEN 800 MG PO TABS: 800.0000 mg | ORAL_TABLET | Freq: Two times a day (BID) | ORAL | 0 refills | Status: AC | PRN

## 2018-11-16 NOTE — Patient Instructions (Signed)
Trigger Finger  Trigger finger (stenosing tenosynovitis) is a condition that causes a finger to get stuck in a bent position. Each finger has a tough, cord-like tissue that connects muscle to bone (tendon), and each tendon is surrounded by a tunnel of tissue (tendon sheath). To move your finger, your tendon needs to slide freely through the sheath. Trigger finger happens when the tendon or the sheath thickens, making it difficult to move your finger. Trigger finger can affect any finger or a thumb. It may affect more than one finger. Mild cases may clear up with rest and medicine. Severe cases require more treatment. What are the causes? Trigger finger is caused by a thickened finger tendon or tendon sheath. The cause of this thickening is not known. What increases the risk? The following factors may make you more likely to develop this condition:  Doing activities that require a strong grip.  Having rheumatoid arthritis, gout, or diabetes.  Being 37-42 years old.  Being a woman. What are the signs or symptoms? Symptoms of this condition include:  Pain when bending or straightening your finger.  Tenderness or swelling where your finger attaches to the palm of your hand.  A lump in the palm of your hand or on the inside of your finger.  Hearing a popping sound when you try to straighten your finger.  Feeling a popping, catching, or locking sensation when you try to straighten your finger.  Being unable to straighten your finger. How is this diagnosed? This condition is diagnosed based on your symptoms and a physical exam. How is this treated? This condition may be treated by:  Resting your finger and avoiding activities that make symptoms worse.  Wearing a finger splint to keep your finger in a slightly bent position.  Taking NSAIDs to relieve pain and swelling.  Injecting medicine (steroids) into the tendon sheath to reduce swelling and irritation. Injections may need to be  repeated.  Having surgery to open the tendon sheath. This may be done if other treatments do not work and you cannot straighten your finger. You may need physical therapy after surgery. Follow these instructions at home:   Use moist heat to help reduce pain and swelling as told by your health care provider.  Rest your finger and avoid activities that make pain worse. Return to normal activities as told by your health care provider.  If you have a splint, wear it as told by your health care provider.  Take over-the-counter and prescription medicines only as told by your health care provider.  Keep all follow-up visits as told by your health care provider. This is important. Contact a health care provider if:  Your symptoms are not improving with home care. Summary  Trigger finger (stenosing tenosynovitis) causes your finger to get stuck in a bent position, and it can make it difficult and painful to straighten your finger.  This condition develops when a finger tendon or tendon sheath thickens.  Treatment starts with resting, wearing a splint, and taking NSAIDs.  In severe cases, surgery to open the tendon sheath may be needed. This information is not intended to replace advice given to you by your health care provider. Make sure you discuss any questions you have with your health care provider. Document Released: 03/27/2004 Document Revised: 05/18/2016 Document Reviewed: 05/18/2016 Elsevier Interactive Patient Education  2019 [employer]. Managing Your Hypertension Hypertension is commonly called high blood pressure. This is when the force of your blood pressing against the walls of  your arteries is too strong. Arteries are blood vessels that carry blood from your heart throughout your body. Hypertension forces the heart to work harder to pump blood, and may cause the arteries to become narrow or stiff. Having untreated or uncontrolled hypertension can cause heart attack, stroke,  kidney disease, and other problems. What are blood pressure readings? A blood pressure reading consists of a higher number over a lower number. Ideally, your blood pressure should be below 120/80. The first ("top") number is called the systolic pressure. It is a measure of the pressure in your arteries as your heart beats. The second ("bottom") number is called the diastolic pressure. It is a measure of the pressure in your arteries as the heart relaxes. What does my blood pressure reading mean? Blood pressure is classified into four stages. Based on your blood pressure reading, your health care provider may use the following stages to determine what type of treatment you need, if any. Systolic pressure and diastolic pressure are measured in a unit called mm Hg. Normal  Systolic pressure: below 469.  Diastolic pressure: below 80. Elevated  Systolic pressure: 629-528.  Diastolic pressure: below 80. Hypertension stage 1  Systolic pressure: 413-244.  Diastolic pressure: 01-02. Hypertension stage 2  Systolic pressure: 725 or above.  Diastolic pressure: 90 or above. What health risks are associated with hypertension? Managing your hypertension is an important responsibility. Uncontrolled hypertension can lead to:  A heart attack.  A stroke.  A weakened blood vessel (aneurysm).  Heart failure.  Kidney damage.  Eye damage.  Metabolic syndrome.  Memory and concentration problems. What changes can I make to manage my hypertension? Hypertension can be managed by making lifestyle changes and possibly by taking medicines. Your health care provider will help you make a plan to bring your blood pressure within a normal range. Eating and drinking   Eat a diet that is high in fiber and potassium, and low in salt (sodium), added sugar, and fat. An example eating plan is called the DASH (Dietary Approaches to Stop Hypertension) diet. To eat this way: ? Eat plenty of fresh fruits and  vegetables. Try to fill half of your plate at each meal with fruits and vegetables. ? Eat whole grains, such as whole wheat pasta, brown rice, or whole grain bread. Fill about one quarter of your plate with whole grains. ? Eat low-fat diary products. ? Avoid fatty cuts of meat, processed or cured meats, and poultry with skin. Fill about one quarter of your plate with lean proteins such as fish, chicken without skin, beans, eggs, and tofu. ? Avoid premade and processed foods. These tend to be higher in sodium, added sugar, and fat.  Reduce your daily sodium intake. Most people with hypertension should eat less than 1,500 mg of sodium a day.  Limit alcohol intake to no more than 1 drink a day for nonpregnant women and 2 drinks a day for men. One drink equals 12 oz of beer, 5 oz of wine, or 1 oz of hard liquor. Lifestyle  Work with your health care provider to maintain a healthy body weight, or to lose weight. Ask what an ideal weight is for you.  Get at least 30 minutes of exercise that causes your heart to beat faster (aerobic exercise) most days of the week. Activities may include walking, swimming, or biking.  Include exercise to strengthen your muscles (resistance exercise), such as weight lifting, as part of your weekly exercise routine. Try to do these types  of exercises for 30 minutes at least 3 days a week.  Do not use any products that contain nicotine or tobacco, such as cigarettes and e-cigarettes. If you need help quitting, ask your health care provider.  Control any long-term (chronic) conditions you have, such as high cholesterol or diabetes. Monitoring  Monitor your blood pressure at home as told by your health care provider. Your personal target blood pressure may vary depending on your medical conditions, your age, and other factors.  Have your blood pressure checked regularly, as often as told by your health care provider. Working with your health care provider  Review all  the medicines you take with your health care provider because there may be side effects or interactions.  Talk with your health care provider about your diet, exercise habits, and other lifestyle factors that may be contributing to hypertension.  Visit your health care provider regularly. Your health care provider can help you create and adjust your plan for managing hypertension. Will I need medicine to control my blood pressure? Your health care provider may prescribe medicine if lifestyle changes are not enough to get your blood pressure under control, and if:  Your systolic blood pressure is 130 or higher.  Your diastolic blood pressure is 80 or higher. Take medicines only as told by your health care provider. Follow the directions carefully. Blood pressure medicines must be taken as prescribed. The medicine does not work as well when you skip doses. Skipping doses also puts you at risk for problems. Contact a health care provider if:  You think you are having a reaction to medicines you have taken.  You have repeated (recurrent) headaches.  You feel dizzy.  You have swelling in your ankles.  You have trouble with your vision. Get help right away if:  You develop a severe headache or confusion.  You have unusual weakness or numbness, or you feel faint.  You have severe pain in your chest or abdomen.  You vomit repeatedly.  You have trouble breathing. Summary  Hypertension is when the force of blood pumping through your arteries is too strong. If this condition is not controlled, it may put you at risk for serious complications.  Your personal target blood pressure may vary depending on your medical conditions, your age, and other factors. For most people, a normal blood pressure is less than 120/80.  Hypertension is managed by lifestyle changes, medicines, or both. Lifestyle changes include weight loss, eating a healthy, low-sodium diet, exercising more, and limiting  alcohol. This information is not intended to replace advice given to you by your health care provider. Make sure you discuss any questions you have with your health care provider. Document Released: 03/01/2012 Document Revised: 05/05/2016 Document Reviewed: 05/05/2016 Elsevier Interactive Patient Education  2019 [employer] Risks of Being Overweight Maintaining a healthy body weight is an important part of your overall health. Your healthy body weight depends on your age, gender, and height. Being overweight puts you at risk for many health problems, including:  Heart disease.  Diabetes.  Problems sleeping.  Joint problems. You can make changes to your diet and lifestyle to prevent these risks. Consider working with a health care provider or a dietitian to make these changes. What nutrition changes can be made?   Eat only as much as your body needs. In most cases, this is about 2,000 calories a day, but the amount varies depending on your height, gender, and activity level. Ask your health care  provider how many calories you should have each day. Eating more than your body needs on a regular basis can cause you to become overweight or obese.  Eat slowly, and stop eating when you feel full.  Choose healthy foods, including: ? Fruits and vegetables. ? Lean meats. ? Low-fat dairy products. ? High-fiber foods, such as whole grains and beans. ? Healthy snacks like vegetable sticks, a piece of fruit, or a small amount of yogurt or cheese.  Avoid foods and drinks that are high in sugar, salt (sodium), saturated fat, or trans fat. This includes: ? Many desserts such as candy, cookies, and ice cream. ? Soda. ? Fried foods. ? Processed meats such as hot dogs or lunch meats. ? Prepackaged snack foods. What lifestyle changes can be made?   Exercise for at least 150 minutes a week to prevent weight gain, or as often as recommended by your health care provider. Do  moderate-intensity exercise, such as brisk walking. ? Spread it out by exercising for 30 minutes 5 days a week, or in short 10-minute bursts several times a day.  Find other ways to stay active and burn calories, such as yard work or a hobby that involves physical activity.  Get at least 8 hours of sleep each night. When you are well-rested, you are more likely to be active and make healthy choices during the day. To sleep better: ? Try to go to bed and wake up at about the same time every day. ? Keep your bedroom dark, quiet, and cool. ? Make sure that your bed is comfortable. ? Avoid stimulating activities, such as watching television or exercising, for at least one hour before bedtime. Why are these changes important? Eating healthy and being active helps you lose weight and prevent health problems caused by being overweight. Making these changes can also help you manage stress, feel better mentally, and connect with friends and family. What can happen if changes are not made? Being overweight can affect you for your entire life. You may develop joint or bone problems that make it painful or difficult for you to play sports or do activities you enjoy. Being overweight puts stress on your heart and lungs and can lead to medical problems like diabetes, heart disease, and sleeping problems. Where to find support You can get support for preventing health risks of being overweight from:  Your health care provider or a dietitian. They can provide guidance about healthy eating and healthy lifestyle choices.  Weight loss support groups, online or in-person. Where to find more information  MyPlate: FormerBoss.no ? This an online tool that provides personalized recommendations about foods to eat each day.  The Centers for Disease Control and Prevention: http://sharp-hammond.biz/ ? This resource gives tips for managing weight and having an active lifestyle. Summary  To prevent unhealthy  weight gain, it is important to maintain a healthy diet high in vegetables and whole grains, exercise regularly, and get at least 8 hours of sleep each night.  Making these changes helps prevent many long-term (chronic) health conditions that can shorten your life, such as diabetes, heart disease, and stroke. This information is not intended to replace advice given to you by your health care provider. Make sure you discuss any questions you have with your health care provider. Document Released: 05/04/2017 Document Revised: 05/04/2017 Document Reviewed: 05/04/2017 Elsevier Interactive Patient Education  2019 [employer].

## 2018-11-16 NOTE — Progress Notes (Signed)
Subjective:    Patient ID: Cassie Mills, female    DOB: Jan 08, 1976, 43 y.o.   MRN: [id number]  42y/o african american female established Pt had presented for RN visit for BP check as part of med management f/u. BP still elevated. No meds on board as she was running late to work this morning last dose lisinopril,amlodipine and hydrochlorothiazide yesterday.  Denied palpations, chest pressure, HA, etc. She has not checked her bottle at home yet to see if she had 10 or 20mg  tabs that she was splitting in half. Reminded to do this and to take 15mg  or 1.5 tabs if they are 10mg  tabs as dispensed by clinic, or 1/2 or 1 tab, whatever is tolerated if 20mg  tabs from a previous external fill, until f/u next week.  Pt also questioned prior trigger finger and hand pain treatment as she was beginning to have increased pain  And finger less straight again. Has not been wearing her wrist or finger splint daily. Had worn finger splint earlier in the week but forgot it today. Appt changed to NP visit for further evaluation.  Had started composition soaks and stated they were helping with pain and swelling.  Wondering if she can try a different pain medication now.     Review of Systems  Constitutional: Negative for activity change, appetite change, chills, diaphoresis, fatigue and fever.  HENT: Negative for trouble swallowing and voice change.   Eyes: Negative for photophobia and visual disturbance.  Respiratory: Negative for cough, chest tightness, shortness of breath, wheezing and stridor.   Cardiovascular: Negative for palpitations and leg swelling.  Gastrointestinal: Negative for diarrhea, nausea and vomiting.  Endocrine: Negative for cold intolerance and heat intolerance.  Genitourinary: Negative for difficulty urinating.  Musculoskeletal: Positive for arthralgias, joint swelling and myalgias. Negative for back pain, gait problem, neck pain and neck stiffness.  Skin: Negative for color change, pallor,  rash and wound.  Allergic/Immunologic: Negative for food allergies.  Neurological: Negative for dizziness, tremors, facial asymmetry, weakness, light-headedness, numbness and headaches.  Hematological: Negative for adenopathy. Does not bruise/bleed easily.  Psychiatric/Behavioral: Negative for agitation, confusion and sleep disturbance.       Objective:   Physical Exam Vitals signs and nursing note reviewed.  Constitutional:      General: She is awake. She is not in acute distress.    Appearance: Normal appearance. She is well-developed and well-groomed. She is obese. She is not ill-appearing, toxic-appearing or diaphoretic.  HENT:     Head: Normocephalic and atraumatic.     Jaw: There is normal jaw occlusion.     Right Ear: External ear normal.     Left Ear: External ear normal.     Nose: Nose normal. No congestion or rhinorrhea.     Mouth/Throat:     Mouth: Mucous membranes are moist.     Pharynx: Oropharynx is clear. No oropharyngeal exudate or posterior oropharyngeal erythema.  Eyes:     General: Lids are normal. No visual field deficit or scleral icterus.       Right eye: No discharge.        Left eye: No discharge.     Extraocular Movements: Extraocular movements intact.     Conjunctiva/sclera: Conjunctivae normal.     Pupils: Pupils are equal, round, and reactive to light.  Neck:     Musculoskeletal: Normal range of motion and neck supple. No neck rigidity or muscular tenderness.     Trachea: Trachea normal.  Cardiovascular:  Rate and Rhythm: Normal rate and regular rhythm.     Pulses: Normal pulses.          Radial pulses are 2+ on the right side and 2+ on the left side.     Heart sounds: Normal heart sounds.  Pulmonary:     Effort: Pulmonary effort is normal. No respiratory distress.     Breath sounds: Normal breath sounds and air entry. No stridor, decreased air movement or transmitted upper airway sounds. No decreased breath sounds, wheezing, rhonchi or rales.      Comments: No cough observed in exam room; spoke full sentences without difficulty Abdominal:     Palpations: Abdomen is soft.  Musculoskeletal:        General: Tenderness present. No swelling.     Right shoulder: Normal.     Left shoulder: Normal.     Right elbow: Normal.    Left elbow: Normal.     Right wrist: Normal.     Left wrist: Normal.     Right hip: Normal.     Left hip: Normal.     Right knee: Normal.     Left knee: Normal.     Right ankle: Normal.     Left ankle: Normal.     Cervical back: Normal.     Thoracic back: Normal.     Lumbar back: Normal.     Right forearm: Normal.     Left forearm: Normal.     Right hand: She exhibits decreased range of motion and tenderness. She exhibits no bony tenderness, normal two-point discrimination, normal capillary refill, no deformity, no laceration and no swelling. Normal sensation noted.     Left hand: Normal.     Right lower leg: No edema.     Left lower leg: No edema.  Lymphadenopathy:     Head:     Right side of head: No submental, submandibular, tonsillar, preauricular, posterior auricular or occipital adenopathy.     Left side of head: No submental, submandibular, tonsillar, preauricular, posterior auricular or occipital adenopathy.     Cervical: No cervical adenopathy.  Skin:    General: Skin is warm and dry.     Capillary Refill: Capillary refill takes less than 2 seconds.     Coloration: Skin is not ashen, cyanotic, jaundiced, mottled, pale or sallow.     Findings: No abrasion, abscess, acne, bruising, burn, ecchymosis, erythema, laceration, lesion, petechiae, rash or wound.     Nails: There is no clubbing.   Neurological:     General: No focal deficit present.     Mental Status: She is alert and oriented to person, place, and time. Mental status is at baseline.     GCS: GCS eye subscore is 4. GCS verbal subscore is 5. GCS motor subscore is 6.     Cranial Nerves: Cranial nerves are intact. No cranial nerve deficit,  dysarthria or facial asymmetry.     Sensory: Sensation is intact. No sensory deficit.     Motor: Motor function is intact. No weakness, tremor, atrophy, abnormal muscle tone or seizure activity.     Coordination: Coordination is intact. Coordination normal.     Gait: Gait is intact. Gait normal.     Comments: Gait sure and steady in clinic; in/out of chair without difficulty; bilateral hand grasp equal 5/5 strength  Psychiatric:        Attention and Perception: Attention and perception normal.        Mood and Affect: Mood and affect  normal.        Speech: Speech normal.        Behavior: Behavior normal. Behavior is cooperative.        Thought Content: Thought content normal.        Cognition and Memory: Cognition and memory normal.        Judgment: Judgment normal.       Finger flexion worsening in rest position.  She restarted finger splint wear today not wearing on presentation to clinic.  Right 4th digit PIP joint pain with passive extension by provider. Not locking or popping PIP with AROM.  No erythema/rash/bruising or swelling noted.  Flexed at 10 degrees PIP joint at rest    Assessment & Plan:  A-essential hypertension, obesity, trigger finger right 4th subsequent visit  P-Trial 2 weeks finger splint again as had stopped wearing and condition worsened pain and flexion at rest.  Now that BP improved trial motrin 800mg  po BID with food x 2 weeks  #30 RF0 dispensed from [facility] to patient.   She didn't feel tylenol helped much.  Contrast bath soaks are helping per patient but she is not consistant with performing them.  Woke up late today didn't take am BP meds.  Discussed NSAIDS can counteract her BP meds if headache/cp/shortness of breath/visual changes to stop motrin and see RN Hildred Alamin for repeat BP.  May need to increase blood pressure medication dosage e.g. lisinopril while on NSAIDS. Encouraged patient to restart wearingfinger splint continuouslywhile at work and home again for support  until no longer working in [employer].Tylenol 1000mg  po QID prn pain may alternate with motrin.  Discussed motrin can cause gastritis important she does not take on empty stomach.  Consider orthopedics consult if no improvement with conservative care.  cryotherapy 15 minutes TID prn and biofreeze gel topical QID prn pain at home. Exitcare handout trigger finger. Patient verbalized understanding information/instructions, agreed with plan of care and had no further questions at this time.  Chronically forgetting to take her blood pressure medications.  Recommend keeping one bottle at work and one at home or put pill manager in her purse with doses.  exitcare handout managing hypertension.continue amlodipine, hydrochlorothiazide and lisinopril increase to 15mg  po daily previously dispensed from [facility] to patient (10mg  is 1 1/2 tabs).  If she does not have any 10mg  tabs left at home notify RN Hildred Alamin and we will get her a bottle. See RN Hildred Alamin next week for BP recheck, ensure she has taken her blood pressure medications daily prior to seeing RN Hildred Alamin at work.  If BP still above 140/90 next week will increase lisinopril to 20mg  po daily if she is taking motrin daily also.  She is to notify us if worsening palpitations, new cough, headache, dizziness, chest pain, nausea or vomiting or fatigue. Encouraged her to continue active lifestyle and healthy diet choices.Patient verbalized understanding information/instructions, agreed with plan of care and had no further questions at this time.

## 2018-11-23 ENCOUNTER — Other Ambulatory Visit: Payer: Self-pay

## 2018-11-23 ENCOUNTER — Ambulatory Visit: Payer: Self-pay | Admitting: *Deleted

## 2018-11-23 VITALS — BP 153/108 | HR 74

## 2018-11-23 DIAGNOSIS — I10 Essential (primary) hypertension: Secondary | ICD-10-CM

## 2018-11-23 NOTE — Progress Notes (Signed)
Pt has taken meds today. Sts she feels like she just really needs to crack down on her personal eating habits and exercise to improve her BP. Also discussed getting a small 1 or 7 day pill dispenser to carry with her in her purse so that if she misses a dose rushing to work in the morning, she has a back up dose with her to take at work. Pt is agreeable to this option.

## 2018-12-05 ENCOUNTER — Telehealth: Payer: Self-pay | Admitting: Registered Nurse

## 2018-12-05 ENCOUNTER — Encounter: Payer: Self-pay | Admitting: Registered Nurse

## 2018-12-05 DIAGNOSIS — I10 Essential (primary) hypertension: Secondary | ICD-10-CM

## 2018-12-05 NOTE — Telephone Encounter (Signed)
Notified patient I had pill holder for her to use in purse to keep a couple doses of her blood pressure medication with her at work in case she forgets to take at home and she is to pick up in clinic.  Patient reported she has taken her medications every day this week and had no missed a dose and she was planning to see RN Hildred Alamin today for BP check.  Patient verbalized understanding information/instructions, agreed with plan of care and had no further questions at this time.

## 2018-12-08 ENCOUNTER — Other Ambulatory Visit: Payer: Self-pay

## 2018-12-08 ENCOUNTER — Ambulatory Visit: Payer: Self-pay | Admitting: *Deleted

## 2018-12-08 VITALS — BP 151/101 | HR 67

## 2018-12-08 DIAGNOSIS — I10 Essential (primary) hypertension: Secondary | ICD-10-CM

## 2018-12-08 NOTE — Progress Notes (Signed)
Pt has not had AM meds in past 2 days. Did take this am and last pm. Denies HTN sx such as palpitations, HA, vision changes, etc. Pt to rtc [place] or Tues next week, hopefully after 4-5 days of consistent med dosing. Single pill containers provided to pt and instructed to keep one dose AM meds in each container and keep in purse/at workstation for days she forgets to take meds at home.

## 2018-12-08 NOTE — Telephone Encounter (Signed)
Pt in to clinic this morning for BP check. She reports that she isn't surprised bp was up (151/101). She forgot her AM meds past 2 days. Took PM meds last night and meds this AM. Pt given pill containers and reminded to place one dose of AM meds in each and keep in her purse or at workstation for days that she forgets to take at home. Then remember to refill once used. She verbalizes understanding and agreement. Plan made for pt to return to clinic Mon or Tues next week for BP recheck, hopefully after 4-5 days of consistent med dosing.

## 2018-12-26 ENCOUNTER — Ambulatory Visit: Payer: Self-pay | Admitting: *Deleted

## 2018-12-26 ENCOUNTER — Encounter: Payer: Self-pay | Admitting: Registered Nurse

## 2018-12-26 ENCOUNTER — Other Ambulatory Visit: Payer: Self-pay

## 2018-12-26 VITALS — BP 138/94 | HR 58

## 2018-12-26 DIAGNOSIS — I10 Essential (primary) hypertension: Secondary | ICD-10-CM

## 2018-12-26 NOTE — Progress Notes (Signed)
Pt reports missing meds Fri, Sat. Took them Sun night, Monday and today. Has 3 pills of each med left today. Will speak with NP about parameters for possibly increasing lisinopril again. She is taking 1.5 tabs and feels well on 15mg . Will fill through [facility] per NP if approved.

## 2018-12-26 NOTE — Telephone Encounter (Signed)
Patient seen in clinic today by RN Hildred Alamin NP offsite due to Covid 19 restrictions after interstate travel.  Patient has missed multiple doses of her BP medications over the past two weeks.  Not controlled BP will continue at current dosing until she has been consistent with medications administration.  Patient was dispensed two purse containers last week by RN Hildred Alamin so she can keep in purse at work doses of her am BP medications in case she forgets to take them at home.  Will bridge refill her medications from [facility] formulary today and encourage patient to follow up with PCM later this month along with [facility] for continued BP checks with RN Hildred Alamin.  Please notify patient.

## 2018-12-26 NOTE — Telephone Encounter (Signed)
Noted please ensure patient scheduled for follow up appt with you this week and me next week

## 2018-12-26 NOTE — Telephone Encounter (Signed)
Pt seen this morning. Visit available for review under RN visit. BP still elevated but improved from previous. Please review RN visit and advise on medication refills. She has not seen pcp yet. They are doing virtual visits only and she was waiting to see if they were going to start taking in person appts after phase 2 but they have not. She is planning to call this week.

## 2018-12-26 NOTE — Telephone Encounter (Signed)
Agree/dispense 1 #90 bottle each lisinopril 10mg , hydrochlorothiazide 25mg  and amlodipine 5mg  and recheck blood pressure next week.  Continue encouraging patient to take all meds every day on a regular schedule.

## 2018-12-26 NOTE — Telephone Encounter (Signed)
Will dispense #90 each of Amlopidine 5mg , Lisinopril 10mg , HCTZ 25mg . Will remind pt to rtc at end of week for BP recheck.

## 2019-01-30 ENCOUNTER — Ambulatory Visit: Payer: Self-pay | Admitting: *Deleted

## 2019-01-30 ENCOUNTER — Encounter: Payer: Self-pay | Admitting: Registered Nurse

## 2019-01-30 ENCOUNTER — Other Ambulatory Visit: Payer: Self-pay

## 2019-01-30 ENCOUNTER — Telehealth: Payer: Self-pay | Admitting: Registered Nurse

## 2019-01-30 VITALS — BP 132/91 | HR 79

## 2019-01-30 DIAGNOSIS — I10 Essential (primary) hypertension: Secondary | ICD-10-CM

## 2019-01-30 MED ORDER — LISINOPRIL 10 MG PO TABS: 15.0000 mg | ORAL_TABLET | Freq: Every day | ORAL | 0 refills | Status: DC

## 2019-01-30 NOTE — Telephone Encounter (Signed)
Patient came to clinic today requesting med refill on lisinopril 15mg  po daily and to have BP check.  Patient forgot to take her pm blood pressure medications last night.  Paper chart review bridge refill 12/26/2018 from [facility] amlodipine 5mg  po BID #180 RF0; hydrochlorothiazide 25mg  po daily #90 RF0 and lisinopril 10mg  take 1.5 tablets po daily#90 RF0. BP 132/91 and pulse 79 today.  Patient reported intermittent palpitations today.  No pedal edema respirations even and unlabored in/out of chair without difficulty; gait sure and steady skin warm dry and pink PERRL.  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  Patient did not tolerate lisinopril 20mg  po daily will continue with 15mg  po daily and intermittent blood pressure checks with RN Hildred Alamin and working on [employer] loss/increasing exercise/walking.  No PCM visit scheduled at this time.  Patient verbalized understanding information/instructions, agreed with plan of care and had no further questions at this time.

## 2019-03-21 LAB — RESULTS CONSOLE HPV: CHL HPV: NEGATIVE

## 2019-03-21 LAB — HM PAP SMEAR: HM Pap smear: NORMAL

## 2019-03-23 ENCOUNTER — Other Ambulatory Visit: Payer: Self-pay | Admitting: Obstetrics and Gynecology

## 2019-03-23 DIAGNOSIS — N6320 Unspecified lump in the left breast, unspecified quadrant: Secondary | ICD-10-CM

## 2019-03-27 ENCOUNTER — Other Ambulatory Visit: Payer: Self-pay

## 2019-04-09 ENCOUNTER — Other Ambulatory Visit: Payer: Self-pay

## 2019-04-20 ENCOUNTER — Other Ambulatory Visit: Payer: Self-pay

## 2019-05-22 ENCOUNTER — Ambulatory Visit: Payer: Self-pay | Admitting: *Deleted

## 2019-05-22 ENCOUNTER — Other Ambulatory Visit: Payer: Self-pay

## 2019-05-22 VITALS — BP 177/103 | HR 66

## 2019-05-22 DIAGNOSIS — I10 Essential (primary) hypertension: Secondary | ICD-10-CM

## 2019-05-22 NOTE — Progress Notes (Signed)
Pt in to pick up med refill. BP check performed. Elevated today. Denies HTN sx. Sts she ran out of HCTZ 2 weeks ago. Since then has been taking Amlodipine 5mg  BID and increased Lisinopril from 1.5 tabs (15mg ) daily to 1 tab (10mg ) in AM and 1.5 tabs (15mg ) in PM. Sts home BP cuff has been reading 130's/?. Asked her to return to clinic on Friday 05/25/19 for recheck and to bring home cuff to compare to manual reading. She is agreeable to this.   Lisinopril, Amlodipine, and HCTZ filled with 45 day supply as bridge until next primary visit.   Due to her current pcp only offering virtual visits, pt scheduled with [facility] instead. Appt with MD Waunita Schooner on 12/17 in office.   Pt has no further questions/concerns.

## 2019-06-07 ENCOUNTER — Ambulatory Visit: Payer: Self-pay | Admitting: Family Medicine

## 2019-06-28 ENCOUNTER — Other Ambulatory Visit: Payer: Self-pay

## 2019-07-04 ENCOUNTER — Ambulatory Visit: Payer: Self-pay | Admitting: Family Medicine

## 2019-07-24 ENCOUNTER — Ambulatory Visit: Payer: Self-pay | Admitting: Family Medicine

## 2019-07-30 ENCOUNTER — Ambulatory Visit: Payer: Self-pay | Admitting: Family Medicine

## 2019-10-08 ENCOUNTER — Ambulatory Visit: Payer: Self-pay | Admitting: Internal Medicine

## 2019-10-09 ENCOUNTER — Ambulatory Visit: Payer: PRIVATE HEALTH INSURANCE | Admitting: Internal Medicine

## 2019-10-09 ENCOUNTER — Encounter: Payer: Self-pay | Admitting: Internal Medicine

## 2019-10-09 ENCOUNTER — Other Ambulatory Visit: Payer: Self-pay

## 2019-10-09 VITALS — BP 144/92 | HR 78 | Temp 98.1°F | Wt 242.0 lb

## 2019-10-09 DIAGNOSIS — R5383 Other fatigue: Secondary | ICD-10-CM | POA: Diagnosis not present

## 2019-10-09 DIAGNOSIS — I10 Essential (primary) hypertension: Secondary | ICD-10-CM | POA: Diagnosis not present

## 2019-10-09 DIAGNOSIS — E538 Deficiency of other specified B group vitamins: Secondary | ICD-10-CM

## 2019-10-09 DIAGNOSIS — D500 Iron deficiency anemia secondary to blood loss (chronic): Secondary | ICD-10-CM

## 2019-10-09 NOTE — Assessment & Plan Note (Signed)
Encouraged medication compliance Continue Lisinopril, HCTZ and Amlodipine Will monitor

## 2019-10-09 NOTE — Patient Instructions (Signed)

## 2019-10-09 NOTE — Assessment & Plan Note (Signed)
Will monitor CBC and iron panel yearly Continue iron supplement

## 2019-10-09 NOTE — Progress Notes (Signed)
HPI  Pt presents to the clinic today to establish care and for management of the conditions listed below. She is transferring care from [facility].  Anemia: She is taking an iron supplement as prescribed. She denies fatigue, SOB or cold intolerance. She does report occasional constipation controlled with diet.  Childhood Asthma: This has not been an issue as an adult. She is not using any inhalers at this time.  HTN: Her BP today is 144/92. She has not been taking Amlodipine, Lisinopril and HCT as prescribed, because she just keeps forgets and her mother recently passed away.  Flu: never Tetanus: > 10 years Covid: 08/2019, 09/2019 Pap Smear: 2020 Mammogram: 2020 Colon Screening: never Vision Screening: as needed  Dentist: biannually   Past Medical History:  Diagnosis Date  . Anemia   . Childhood asthma   . Frequent headaches   . Heart murmur   . History of chicken pox 1984  . Hypertension   . Menorrhagia    due to fibroids  . Uterine fibroid     Current Outpatient Medications  Medication Sig Dispense Refill  . amLODipine (NORVASC) 5 MG tablet Take 1 tablet (5 mg total) by mouth 2 (two) times daily. 180 tablet 0  . ferrous sulfate 325 (65 FE) MG tablet Take 325 mg by mouth daily with breakfast.    . hydrochlorothiazide (HYDRODIURIL) 25 MG tablet Take 1 tablet (25 mg total) by mouth every morning. 90 tablet 0  . lisinopril (ZESTRIL) 10 MG tablet Take 1.5 tablets (15 mg total) by mouth daily. 90 tablet 0  . megestrol (MEGACE) 40 MG tablet megestrol 40 mg tablet  Take 1 tablet every day by oral route in the morning for 30 days.     No current facility-administered medications for this visit.    No Known Allergies  Family History  Problem Relation Age of Onset  . Arthritis Mother   . Hypertension Mother   . Kidney disease Mother   . Diabetes Mother   . Diabetes Maternal Aunt   . Kidney disease Maternal Aunt   . Diabetes Maternal Uncle   . Kidney disease Maternal Uncle   .  Heart failure Paternal Aunt   . CAD Paternal Uncle   . Cancer Father        throat (smoker)  . Cancer Paternal Grandmother        throat (smoker)  . Stroke Neg Hx     Social History   Socioeconomic History  . Marital status: Single    Spouse name: Not on file  . Number of children: Not on file  . Years of education: Not on file  . Highest education level: Not on file  Occupational History  . Not on file  Tobacco Use  . Smoking status: Never Smoker  . Smokeless tobacco: Never Used  Substance and Sexual Activity  . Alcohol use: No  . Drug use: No  . Sexual activity: Yes  Other Topics Concern  . 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 Difficulty of Paying Living Expenses:   Food Insecurity:   . Worried About Charity fundraiser in the Last Year:   . Arboriculturist in the Last Year:   Transportation Needs:   .  Lack of Transportation (Medical):   Marland Kitchen Lack of Transportation (Non-Medical):   Physical Activity:   . Days of Exercise per Week:   . Minutes of Exercise per Session:   Stress:   . Feeling of Stress :   Social Connections:   . Frequency of Communication with Friends and Family:   . Frequency of Social Gatherings with Friends and Family:   . Attends Religious Services:   . Active Member of Clubs or Organizations:   . Attends Archivist Meetings:   Marland Kitchen Marital Status:   Intimate Partner Violence:   . Fear of Current or Ex-Partner:   . Emotionally Abused:   Marland Kitchen Physically Abused:   . Sexually Abused:     ROS:  Constitutional: Pt reports fatigue. Denies fever, malaise, headache or abrupt weight changes.  HEENT: Denies eye pain, eye redness, ear pain, ringing in the ears, wax buildup, runny nose, nasal congestion, bloody nose, or  sore throat. Respiratory: Denies difficulty breathing, shortness of breath, cough or sputum production.   Cardiovascular: Denies chest pain, chest tightness, palpitations or swelling in the hands or feet.  Gastrointestinal: Denies abdominal pain, bloating, constipation, diarrhea or blood in the stool.  GU: Denies frequency, urgency, pain with urination, blood in urine, odor or discharge. Musculoskeletal: Denies decrease in range of motion, difficulty with gait, muscle pain or joint pain and swelling.  Skin: Denies redness, rashes, lesions or ulcercations.  Neurological: Denies dizziness, difficulty with memory, difficulty with speech or problems with balance and coordination.  Psych: Denies anxiety, depression, SI/HI.  No other specific complaints in a complete review of systems (except as listed in HPI above).  PE:  BP (!) 144/92   Pulse 78   Temp 98.1 F (36.7 C) (Temporal)   Wt 242 lb (109.8 kg)   LMP 10/06/2019   SpO2 98%   BMI 40.27 kg/m   Wt Readings from Last 3 Encounters:  08/25/18 250 lb (113.4 kg)  03/12/14 272 lb 4 oz (123.5 kg)  02/22/14 270 lb (122.5 kg)    General: Appears her stated age, obese, in NAD. Skin: HEENT: Head: normal shape and size; Eyes: sclera white, no icterus, conjunctiva pink, PERRLA and EOMs intact;  Cardiovascular: Normal rate and rhythm. S1,S2 noted.  No murmur, rubs or gallops noted.  Pulmonary/Chest: Normal effort and positive vesicular breath sounds. No respiratory distress. No wheezes, rales or ronchi noted.  Neurological: Alert and oriented.  Psychiatric: Mood and affect normal. Behavior is normal. Judgment and thought content normal.     BMET    Component Value Date/Time   NA 139 08/25/2018 0902   K 3.9 08/25/2018 0902   CL 102 08/25/2018 0902   CO2 24 01/07/2014 0917   GLUCOSE 88 08/25/2018 0902   GLUCOSE 72 01/07/2014 0917   BUN 12 08/25/2018 0902   CREATININE 0.90 08/25/2018 0902   CALCIUM 9.4 08/25/2018 0902   GFRNONAA 78  08/25/2018 0902   GFRAA 90 08/25/2018 0902    Lipid Panel     Component Value Date/Time   CHOL 128 08/25/2018 0902   TRIG 47 08/25/2018 0902   HDL 55 08/25/2018 0902   CHOLHDL 2.3 08/25/2018 0902   CHOLHDL 3 01/07/2014 0917   VLDL 12.8 01/07/2014 0917   LDLCALC 64 08/25/2018 0902    CBC    Component Value Date/Time   WBC 3.9 08/25/2018 0902   WBC 5.0 01/07/2014 0917   RBC 4.51 08/25/2018 0902   RBC 4.28 01/07/2014 0917   HGB 11.6  08/25/2018 0902   HCT 35.2 08/25/2018 0902   PLT 256 08/25/2018 0902   MCV 78 (L) 08/25/2018 0902   MCH 25.7 (L) 08/25/2018 0902   MCHC 33.0 08/25/2018 0902   MCHC 32.3 01/07/2014 0917   RDW 15.6 (H) 08/25/2018 0902   LYMPHSABS 1.2 08/25/2018 0902   MONOABS 0.4 01/07/2014 0917   EOSABS 0.1 08/25/2018 0902   BASOSABS 0.0 08/25/2018 0902    Hgb A1C Lab Results  Component Value Date   HGBA1C 5.5 08/25/2018     Assessment and Plan:  Fatigue:  Will check TSH, Vit D and B12 She is interested in B12 injections- will follow up with her on this  Make an appt for your annual exam  Webb Silversmith, NP This visit occurred during the SARS-CoV-2 public health emergency.  Safety protocols were in place, including screening questions prior to the visit, additional usage of staff PPE, and extensive cleaning of exam room while observing appropriate contact time as indicated for disinfecting solutions.

## 2019-10-10 LAB — VITAMIN B12: Vitamin B-12: 252 pg/mL (ref 211–911)

## 2019-10-10 LAB — TSH: TSH: 0.81 u[IU]/mL (ref 0.35–4.50)

## 2019-10-10 LAB — VITAMIN D 25 HYDROXY (VIT D DEFICIENCY, FRACTURES): VITD: 9.82 ng/mL — ABNORMAL LOW (ref 30.00–100.00)

## 2019-10-11 ENCOUNTER — Ambulatory Visit: Payer: PRIVATE HEALTH INSURANCE

## 2019-10-12 ENCOUNTER — Telehealth: Payer: Self-pay

## 2019-10-12 NOTE — Telephone Encounter (Signed)
Left detailed msg on VM per HIPAA letting pt know her nurse visit for her 1st B12 is scheduled for 10/18/2019 at 3:15.... if this does not work for her, let us know and reschedule to a more convenient day and time for her

## 2019-10-18 ENCOUNTER — Ambulatory Visit (INDEPENDENT_AMBULATORY_CARE_PROVIDER_SITE_OTHER): Payer: PRIVATE HEALTH INSURANCE

## 2019-10-18 DIAGNOSIS — D500 Iron deficiency anemia secondary to blood loss (chronic): Secondary | ICD-10-CM | POA: Diagnosis not present

## 2019-10-18 MED ORDER — CYANOCOBALAMIN 1000 MCG/ML IJ SOLN
1000.0000 ug | Freq: Once | INTRAMUSCULAR | Status: AC
  Administered 2019-10-18: 1000 ug via INTRAMUSCULAR

## 2019-10-18 NOTE — Progress Notes (Signed)
Per orders of [facility], injection of B12 given by Randall An. Patient tolerated injection well.

## 2019-10-19 DIAGNOSIS — E538 Deficiency of other specified B group vitamins: Secondary | ICD-10-CM | POA: Insufficient documentation

## 2019-10-19 MED ORDER — VITAMIN D (ERGOCALCIFEROL) 1.25 MG (50000 UNIT) PO CAPS: 50000.0000 [IU] | ORAL_CAPSULE | ORAL | 0 refills | Status: DC

## 2019-10-19 NOTE — Addendum Note (Signed)
Addended by: Lurlean Nanny on: 10/19/2019 11:33 AM   Modules accepted: Orders

## 2019-10-25 ENCOUNTER — Telehealth: Payer: Self-pay | Admitting: Internal Medicine

## 2019-10-25 ENCOUNTER — Ambulatory Visit: Payer: Self-pay | Admitting: Registered Nurse

## 2019-10-25 ENCOUNTER — Other Ambulatory Visit: Payer: Self-pay

## 2019-10-25 ENCOUNTER — Encounter: Payer: Self-pay | Admitting: Registered Nurse

## 2019-10-25 VITALS — HR 70 | Resp 16

## 2019-10-25 DIAGNOSIS — L309 Dermatitis, unspecified: Secondary | ICD-10-CM

## 2019-10-25 MED ORDER — DIPHENHYDRAMINE HCL 2 % EX GEL: 1.0000 "application " | Freq: Two times a day (BID) | CUTANEOUS | Status: AC | PRN

## 2019-10-25 MED ORDER — HYDROCORTISONE 1 % EX LOTN: 1.0000 "application " | TOPICAL_LOTION | Freq: Two times a day (BID) | CUTANEOUS | 0 refills | Status: AC

## 2019-10-25 MED ORDER — LORATADINE 10 MG PO TABS: 10.0000 mg | ORAL_TABLET | Freq: Every day | ORAL | 0 refills | Status: DC

## 2019-10-25 NOTE — Patient Instructions (Signed)
Drug Rash  A drug rash occurs when a medicine causes a change in the color or texture of the skin. It can develop minutes, hours, or days after you take the medicine. The rash may appear on a small area of skin or all over your body. What are the causes? This condition is usually caused by your body's reaction (allergy) to a medicine. It can also be caused by exposure to sunlight after taking a medicine that makes your skin sensitive to light. Though any medicine can cause a rash or reaction, medicines that are more likely to cause rashes include:  Penicillin.  Antibiotic medicines.  Medicines that treat seizures.  Medicines that treat cancer (chemotherapy).  Aspirin and other NSAIDs.  Injectable dyes that contain iodine.  Insulin. What are the signs or symptoms? Symptoms of this condition include:  Redness.  Tiny bumps.  Peeling.  Itching.  Itchy welts (hives).  Swelling. How is this diagnosed? This condition may be diagnosed based on:  A physical exam.  Tests to find out which medicine caused the rash. These tests may include: ? Skin tests. ? Blood tests. ? Challenge test. For this test, you stop taking all the medicines that you do not need to take. Then, you start taking them again by adding back one medicine at a time. How is this treated? This condition is treated with medicines, including:  Antihistamine. This may be given to relieve itching.  NSAIDs. These may be given to reduce swelling and to treat pain.  A steroid medicine. This may be given to reduce swelling. The rash usually goes away when you stop taking the medicine that caused it. Follow these instructions at home:  Take over-the-counter and prescription medicines only as told by your health care provider.  Tell all your health care providers about any medicine reactions that you have had in the past.  If your rash was caused by sensitivity to sunlight, and while your rash is healing: ?  Avoid being in the sun if possible, especially when it is strongest, usually between 10 a.m. and 4 p.m. ? Cover your skin with pants, long sleeves, and a hat when you are exposed to sunlight.  If you have hives: ? Take a cool shower or use a cool compress to relieve itchiness. ? Take over-the-counter antihistamines, as recommended by your health care provider, until the hives are gone. Hives are not contagious.  Keep all follow-up visits as told by your health care provider. This is important. Contact a health care provider if you have:  A fever.  A rash that is not going away.  A rash that gets worse.  A rash that comes back.  Wheezing or coughing. Get help right away if:  You start to have breathing problems.  You start to have shortness of breath.  Your face or throat starts to swell.  You have severe weakness with dizziness or fainting.  You have chest pain. These symptoms may represent a serious problem that is an emergency. Do not wait to see if the symptoms will go away. Get medical help right away. Call your local emergency services (911 in the [place] Do not drive yourself to the hospital. Summary  A drug rash occurs when a medicine causes a change in the color or texture of the skin. The rash may appear on a small area of skin or all over your body.  It can develop minutes, hours, or days after you take the medicine.  Your health care   provider will do various tests to determine what medicine caused your rash.  The rash may be treated with medicine to relieve itching, swelling, and pain. This information is not intended to replace advice given to you by your health care provider. Make sure you discuss any questions you have with your health care provider. Document Revised: 05/20/2017 Document Reviewed: 04/28/2017 Elsevier Patient Education  2020 [place] Dermatitis Dermatitis is redness, soreness, and swelling (inflammation) of the skin. Contact  dermatitis is a reaction to certain substances that touch the skin. Many different substances can cause contact dermatitis. There are two types of contact dermatitis:  Irritant contact dermatitis. This type is caused by something that irritates your skin, such as having dry hands from washing them too often with soap. This type does not require previous exposure to the substance for a reaction to occur. This is the most common type.  Allergic contact dermatitis. This type is caused by a substance that you are allergic to, such as poison ivy. This type occurs when you have been exposed to the substance (allergen) and develop a sensitivity to it. Dermatitis may develop soon after your first exposure to the allergen, or it may not develop until the next time you are exposed and every time thereafter. What are the causes? Irritant contact dermatitis is most commonly caused by exposure to:  Makeup.  Soaps.  Detergents.  Bleaches.  Acids.  Metal salts, such as nickel. Allergic contact dermatitis is most commonly caused by exposure to:  Poisonous plants.  Chemicals.  Jewelry.  Latex.  Medicines.  Preservatives in products, such as clothing. What increases the risk? You are more likely to develop this condition if you have:  A job that exposes you to irritants or allergens.  Certain medical conditions, such as asthma or eczema. What are the signs or symptoms? Symptoms of this condition may occur on your body anywhere the irritant has touched you or is touched by you.  Symptoms include: ? Dryness or flaking. ? Redness. ? Cracks. ? Itching. ? Pain or a burning feeling. ? Blisters. ? Drainage of small amounts of blood or clear fluid from skin cracks. With allergic contact dermatitis, there may also be swelling in areas such as the eyelids, mouth, or genitals. How is this diagnosed? This condition is diagnosed with a medical history and physical exam.  A patch skin test may  be performed to help determine the cause.  If the condition is related to your job, you may need to see an occupational medicine specialist. How is this treated? This condition is treated by checking for the cause of the reaction and protecting your skin from further contact. Treatment may also include:  Steroid creams or ointments. Oral steroid medicines may be needed in more severe cases.  Antibiotic medicines or antibacterial ointments, if a skin infection is present.  Antihistamine lotion or an antihistamine taken by mouth to ease itching.  A bandage (dressing). Follow these instructions at home: Skin care  Moisturize your skin as needed.  Apply cool compresses to the affected areas.  Try applying baking soda paste to your skin. Stir water into baking soda until it reaches a paste-like consistency.  Do not scratch your skin, and avoid friction to the affected area.  Avoid the use of soaps, perfumes, and dyes. Medicines  Take or apply over-the-counter and prescription medicines only as told by your health care provider.  If you were prescribed an antibiotic medicine, take or apply the antibiotic as  told by your health care provider. Do not stop using the antibiotic even if your condition improves. Bathing  Try taking a bath with: ? Epsom salts. Follow the instructions on the packaging. You can get these at your local pharmacy or grocery store. ? Baking soda. Pour a small amount into the bath as directed by your health care provider. ? Colloidal oatmeal. Follow the instructions on the packaging. You can get this at your local pharmacy or grocery store.  Bathe less frequently, such as every other day.  Bathe in lukewarm water. Avoid using hot water. Bandage care  If you were given a bandage (dressing), change it as told by your health care provider.  Wash your hands with soap and water before and after you change your dressing. If soap and water are not available, use  hand sanitizer. General instructions  Avoid the substance that caused your reaction. If you do not know what caused it, keep a journal to try to track what caused it. Write down: ? What you eat. ? What cosmetic products you use. ? What you drink. ? What you wear in the affected area. This includes jewelry.  Check the affected areas every day for signs of infection. Check for: ? More redness, swelling, or pain. ? More fluid or blood. ? Warmth. ? Pus or a bad smell.  Keep all follow-up visits as told by your health care provider. This is important. Contact a health care provider if:  Your condition does not improve with treatment.  Your condition gets worse.  You have signs of infection such as swelling, tenderness, redness, soreness, or warmth in the affected area.  You have a fever.  You have new symptoms. Get help right away if:  You have a severe headache, neck pain, or neck stiffness.  You vomit.  You feel very sleepy.  You notice red streaks coming from the affected area.  Your bone or joint underneath the affected area becomes painful after the skin has healed.  The affected area turns darker.  You have difficulty breathing. Summary  Dermatitis is redness, soreness, and swelling (inflammation) of the skin. Contact dermatitis is a reaction to certain substances that touch the skin.  Symptoms of this condition may occur on your body anywhere the irritant has touched you or is touched by you.  This condition is treated by figuring out what caused the reaction and protecting your skin from further contact. Treatment may also include medicines and skin care.  Avoid the substance that caused your reaction. If you do not know what caused it, keep a journal to try to track what caused it.  Contact a health care provider if your condition gets worse or you have signs of infection such as swelling, tenderness, redness, soreness, or warmth in the affected area. This  information is not intended to replace advice given to you by your health care provider. Make sure you discuss any questions you have with your health care provider. Document Revised: 09/27/2018 Document Reviewed: 12/21/2017 Elsevier Patient Education  Day.

## 2019-10-25 NOTE — Telephone Encounter (Signed)
Patient is requesting a call back from [facility]  She stated she called at the beginning of the week and spoke with a nurse about a reaction to the b12 injection she received., She stated she has noticed her arm is very red and swollen.   I dont know see in her chart that she spoke with anyone but patient was Adamant that she did speak with a nurse and advised the nurse that a b12 injection may not be something she is able to do again   Please advise

## 2019-10-25 NOTE — Telephone Encounter (Signed)
Would recommend oral B12 1000 mcg SQ daily if unable to do injections.

## 2019-10-25 NOTE — Progress Notes (Signed)
Subjective:    Patient ID: Cassie Mills, female    DOB: 1975/08/03, 44 y.o.   MRN: [id number]  43y/o african [place] female here for left arm pain and rash after B12 injection PCM office yesterday.  Arm hurting and then rash popped up, itchy.  Received injection in her car didn't go into office.  Denied new hygiene products, laundry products, cleaning products at home.  Works in [employer].     Review of Systems  Constitutional: Negative for activity change, appetite change, chills, diaphoresis, fatigue and fever.  HENT: Negative for trouble swallowing and voice change.   Eyes: Negative for photophobia, pain, discharge, redness, itching and visual disturbance.  Respiratory: Negative for cough, choking, chest tightness, shortness of breath, wheezing and stridor.   Cardiovascular: Negative for chest pain and palpitations.  Gastrointestinal: Negative for abdominal pain, diarrhea, nausea and vomiting.  Endocrine: Negative for cold intolerance and heat intolerance.  Genitourinary: Negative for difficulty urinating.  Musculoskeletal: Positive for myalgias. Negative for arthralgias, back pain, gait problem, joint swelling, neck pain and neck stiffness.  Skin: Positive for color change and rash. Negative for pallor and wound.  Allergic/Immunologic: Negative for environmental allergies and food allergies.  Neurological: Negative for dizziness, tremors, seizures, syncope, facial asymmetry, speech difficulty, weakness, light-headedness, numbness and headaches.  Hematological: Negative for adenopathy. Does not bruise/bleed easily.  Psychiatric/Behavioral: Negative for agitation, confusion and sleep disturbance.       Objective:   Physical Exam Vitals reviewed.  Constitutional:      General: She is awake. She is not in acute distress.    Appearance: Normal appearance. She is well-developed and well-groomed. She is obese. She is not ill-appearing, toxic-appearing or  diaphoretic.  HENT:     Head: Normocephalic and atraumatic.     Jaw: There is normal jaw occlusion.     Salivary Glands: Right salivary gland is not diffusely enlarged or tender. Left salivary gland is not diffusely enlarged or tender.     Right Ear: Hearing and external ear normal.     Left Ear: Hearing and external ear normal.     Nose: Nose normal.     Mouth/Throat:     Mouth: No angioedema.     Pharynx: Oropharynx is clear. No pharyngeal swelling.  Eyes:     General: Lids are normal. Vision grossly intact. Gaze aligned appropriately. No allergic shiner, visual field deficit or scleral icterus.       Right eye: No discharge.        Left eye: No discharge.     Extraocular Movements: Extraocular movements intact.     Conjunctiva/sclera: Conjunctivae normal.     Pupils: Pupils are equal, round, and reactive to light.  Neck:     Trachea: Trachea and phonation normal. No tracheal deviation.  Cardiovascular:     Rate and Rhythm: Normal rate and regular rhythm.     Pulses: Normal pulses.          Radial pulses are 2+ on the right side and 2+ on the left side.     Heart sounds: Normal heart sounds.  Pulmonary:     Effort: Pulmonary effort is normal. No respiratory distress.     Breath sounds: Normal breath sounds and air entry. No stridor, decreased air movement or transmitted upper airway sounds. No decreased breath sounds, wheezing, rhonchi or rales.     Comments: Spoke full sentences without difficulty; no cough observed in clinic; wearing cloth mask due to covid 19 pandemic Abdominal:  Palpations: Abdomen is soft.  Musculoskeletal:        General: Swelling and tenderness present. No deformity. Normal range of motion.     Right shoulder: Normal.     Left shoulder: Normal.     Right upper arm: Normal.     Left upper arm: Swelling and tenderness present. No edema, deformity, lacerations or bony tenderness.     Right elbow: Normal.     Left elbow: Normal.     Right forearm:  Normal.     Left forearm: Normal.     Right hand: Normal.     Left hand: Normal.       Arms:     Cervical back: Normal range of motion and neck supple. No edema, erythema, signs of trauma, rigidity, torticollis or crepitus. No pain with movement. Normal range of motion.     Right lower leg: No edema.     Left lower leg: No edema.     Comments: 0-1+/4 localized nonpitting edema; soft tissue TTP deltoid muscle and tricep lateral;  increased temperature no fluctuance or induration no ecchymosis;  2 nummular grouped rashes 2cm diameter lateral left upper arm deltoid and 2cm above elbow joint dry  Lymphadenopathy:     Head:     Right side of head: No submental, submandibular or preauricular adenopathy.     Left side of head: No submental, submandibular or preauricular adenopathy.     Cervical: No cervical adenopathy.     Right cervical: No superficial cervical adenopathy.    Left cervical: No superficial cervical adenopathy.  Skin:    General: Skin is warm and dry.     Capillary Refill: Capillary refill takes less than 2 seconds.     Coloration: Skin is not ashen, cyanotic, jaundiced, mottled, pale or sallow.     Findings: Erythema and rash present. No abrasion, abscess, acne, bruising, burn, ecchymosis, laceration, lesion, petechiae or wound. Rash is macular, papular and vesicular. Rash is not crusting, nodular, purpuric, pustular, scaling or urticarial.     Nails: There is no clubbing.          Comments: Grouped nummular papules and vesicles on erythematous base 2cm deltoid left and 2 cm superior to elbow joint another grouped nummular papules and vesicles on erythematous base increased temperature and TTP but dry   Neurological:     General: No focal deficit present.     Mental Status: She is alert and oriented to person, place, and time. Mental status is at baseline.     GCS: GCS eye subscore is 4. GCS verbal subscore is 5. GCS motor subscore is 6.     Cranial Nerves: Cranial nerves are  intact. No cranial nerve deficit, dysarthria or facial asymmetry.     Sensory: Sensation is intact. No sensory deficit.     Motor: Motor function is intact. No weakness, tremor, atrophy, abnormal muscle tone or seizure activity.     Coordination: Coordination is intact. Coordination normal.     Gait: Gait is intact. Gait normal.     Comments: Gait sure and steady in clinic; bilateral hand grasp equal 5/5  Psychiatric:        Attention and Perception: Attention and perception normal.        Mood and Affect: Mood and affect normal.        Speech: Speech normal.        Behavior: Behavior normal. Behavior is cooperative.        Thought Content: Thought content normal.  Cognition and Memory: Cognition and memory normal.        Judgment: Judgment normal.          one UD calagel applied to left arm rashes in clinic  Assessment & Plan:  A-dermatitis, left arm pain initial visit  P-tylenol 1000mg  po every 6 hours as needed for pain due to hypertension.  If no relief with ice/tylenol can consider motrin 800mg  by mouth every 8 hours take with food to avoid stomach upset.  Can counteract blood pressure medication/cause kidney strain.  Avoid dehydration.  Exitcare handout on contact dermatitis and drug rash printed and given to patient.  Alternate ice 5 minutes prn swelling/itching; calagel may apply to affected area up to every 6 hours and hydrocortisone 1% topical every 12 hours prn itching  Start claritin 10mg  po daily OTC given 2 UD from clinic stock.  PCM CC'd on office note since rash started after B12 injection from her office.  May consider benadryl 25mg  po QHS prn breakthrough itching or switch to zyrtec 10mg  po every 12 hours without drowsiness or other side effects may continue or switch to zyrtec 10mg  po BID prn itching if no relief with claritin and calagel/hydrocortisone.  calagel thin smear BID prn itching given 4 UD from clinic stock; do not get in eyes; if worsening with calagel  use stop and trial hydrocortisone 1% topical BID small smear affected areas on left arm.  Wash hands before and after application.  Avoid hot steam showers.  Apply emollient twice a day e.g. Fragrance free vaseline or cocoa butter.  May apply ice/cold compress 5 minutes QID prn itching/swelling.   Avoid rubbing affected area.  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  Shower when she finishes work/prior to bedtime.  Avoid harsh/abrasive soaps use fragrance free/sensitive like dove/cetaphil.    Medication as directed. Call or return to clinic as needed if these symptoms worsen or fail to improve as anticipated. Follow up for re-evaluation tomorrow with RN Hildred Alamin if worsening or no improvement with plan of care  Patient verbalized agreement and understanding of treatment plan and had no further questions at this time

## 2019-10-26 NOTE — Telephone Encounter (Signed)
Pt would like to to give next injection to see how that goes before changing to oral..Marland Kitchen

## 2019-11-01 ENCOUNTER — Ambulatory Visit: Payer: PRIVATE HEALTH INSURANCE | Admitting: Internal Medicine

## 2019-11-01 ENCOUNTER — Encounter: Payer: Self-pay | Admitting: Internal Medicine

## 2019-11-01 ENCOUNTER — Ambulatory Visit: Payer: PRIVATE HEALTH INSURANCE

## 2019-11-01 ENCOUNTER — Other Ambulatory Visit: Payer: Self-pay

## 2019-11-01 VITALS — BP 144/90 | HR 71 | Temp 97.9°F | Ht 65.0 in | Wt 238.0 lb

## 2019-11-01 DIAGNOSIS — R21 Rash and other nonspecific skin eruption: Secondary | ICD-10-CM

## 2019-11-01 DIAGNOSIS — M79602 Pain in left arm: Secondary | ICD-10-CM

## 2019-11-01 MED ORDER — METHYLPREDNISOLONE ACETATE 80 MG/ML IJ SUSP
80.0000 mg | Freq: Once | INTRAMUSCULAR | Status: AC
  Administered 2019-11-01: 80 mg via INTRAMUSCULAR

## 2019-11-01 NOTE — Patient Instructions (Signed)
Rash, Adult  A rash is a change in the color of your skin. A rash can also change the way your skin feels. There are many different conditions and factors that can cause a rash. Follow these instructions at home: The goal of treatment is to stop the itching and keep the rash from spreading. Watch for any changes in your symptoms. Let your doctor know about them. Follow these instructions to help with your condition: Medicine Take or apply over-the-counter and prescription medicines only as told by your doctor. These may include medicines:  To treat red or swollen skin (corticosteroid creams).  To treat itching.  To treat an allergy (oral antihistamines).  To treat very bad symptoms (oral corticosteroids).  Skin care  Put cool cloths (compresses) on the affected areas.  Do not scratch or rub your skin.  Avoid covering the rash. Make sure that the rash is exposed to air as much as possible. Managing itching and discomfort  Avoid hot showers or baths. These can make itching worse. A cold shower may help.  Try taking a bath with: ? Epsom salts. You can get these at your local pharmacy or grocery store. Follow the instructions on the package. ? Baking soda. Pour a small amount into the bath as told by your doctor. ? Colloidal oatmeal. You can get this at your local pharmacy or grocery store. Follow the instructions on the package.  Try putting baking soda paste onto your skin. Stir water into baking soda until it gets like a paste.  Try putting on a lotion that relieves itchiness (calamine lotion).  Keep cool and out of the sun. Sweating and being hot can make itching worse. General instructions   Rest as needed.  Drink enough fluid to keep your pee (urine) pale yellow.  Wear loose-fitting clothing.  Avoid scented soaps, detergents, and perfumes. Use gentle soaps, detergents, perfumes, and other cosmetic products.  Avoid anything that causes your rash. Keep a journal to  help track what causes your rash. Write down: ? What you eat. ? What cosmetic products you use. ? What you drink. ? What you wear. This includes jewelry.  Keep all follow-up visits as told by your doctor. This is important. Contact a doctor if:  You sweat at night.  You lose weight.  You pee (urinate) more than normal.  You pee less than normal, or you notice that your pee is a darker color than normal.  You feel weak.  You throw up (vomit).  Your skin or the whites of your eyes look yellow (jaundice).  Your skin: ? Tingles. ? Is numb.  Your rash: ? Does not go away after a few days. ? Gets worse.  You are: ? More thirsty than normal. ? More tired than normal.  You have: ? New symptoms. ? Pain in your belly (abdomen). ? A fever. ? Watery poop (diarrhea). Get help right away if:  You have a fever and your symptoms suddenly get worse.  You start to feel mixed up (confused).  You have a very bad headache or a stiff neck.  You have very bad joint pains or stiffness.  You have jerky movements that you cannot control (seizure).  Your rash covers all or most of your body. The rash may or may not be painful.  You have blisters that: ? Are on top of the rash. ? Grow larger. ? Grow together. ? Are painful. ? Are inside your nose or mouth.  You have a rash   that: ? Looks like purple pinprick-sized spots all over your body. ? Has a "bull's eye" or looks like a target. ? Is red and painful, causes your skin to peel, and is not from being in the sun too long. Summary  A rash is a change in the color of your skin. A rash can also change the way your skin feels.  The goal of treatment is to stop the itching and keep the rash from spreading.  Take or apply over-the-counter and prescription medicines only as told by your doctor.  Contact a doctor if you have new symptoms or symptoms that get worse.  Keep all follow-up visits as told by your doctor. This is  important. This information is not intended to replace advice given to you by your health care provider. Make sure you discuss any questions you have with your health care provider. Document Revised: 09/29/2018 Document Reviewed: 01/09/2018 Elsevier Patient Education  2020 Elsevier Inc.  

## 2019-11-01 NOTE — Addendum Note (Signed)
Addended by: Karle Barr on: 11/01/2019 03:28 PM   Modules accepted: Orders

## 2019-11-01 NOTE — Progress Notes (Signed)
Subjective:    Patient ID: Cassie Mills, female    DOB: 12-16-1975, 44 y.o.   MRN: [id number]  HPI  Patient presents to the clinic today with complaint of a rash and pain in her left arm after receiving her B12 injection 2 weeks ago. She reports the rash was itchy and seem to spread down around her elbow. She reports associated pain in the left arm which she describes as pressure and burning. She denies any injury to the area. She had never had a B12 injection prior. She thinks it may have been shingles, but has no pictures of the rash when it initially started. The rash has improved with steroid cream but the pain in her arm persists.  Review of Systems      Past Medical History:  Diagnosis Date  . Anemia   . Childhood asthma   . Heart murmur   . History of chicken pox 1984  . Hypertension   . Menorrhagia    due to fibroids  . Uterine fibroid     Current Outpatient Medications  Medication Sig Dispense Refill  . amLODipine (NORVASC) 5 MG tablet Take 1 tablet (5 mg total) by mouth 2 (two) times daily. 180 tablet 0  . DIPHENHYDRAMINE HCL, TOPICAL, 2 % GEL Apply 1 application topically 2 (two) times daily as needed for up to 7 days.    . ferrous sulfate 325 (65 FE) MG tablet Take 325 mg by mouth daily with breakfast.    . hydrochlorothiazide (HYDRODIURIL) 25 MG tablet Take 1 tablet (25 mg total) by mouth every morning. 90 tablet 0  . hydrocortisone 1 % lotion Apply 1 application topically 2 (two) times daily for 7 days. 8 mL 0  . lisinopril (ZESTRIL) 10 MG tablet Take 1.5 tablets (15 mg total) by mouth daily. 90 tablet 0  . loratadine (CLARITIN) 10 MG tablet Take 1 tablet (10 mg total) by mouth daily for 7 days. 2 tablet 0  . Vitamin D, Ergocalciferol, (DRISDOL) 1.25 MG (50000 UNIT) CAPS capsule Take 1 capsule (50,000 Units total) by mouth every 7 (seven) days. 12 capsule 0   No current facility-administered medications for this visit.    No Known Allergies  Family History   Problem Relation Age of Onset  . Arthritis Mother   . Hypertension Mother   . Kidney disease Mother   . Diabetes Mother   . Diabetes Maternal Aunt   . Kidney disease Maternal Aunt   . Diabetes Maternal Uncle   . Kidney disease Maternal Uncle   . Heart failure Paternal Aunt   . CAD Paternal Uncle   . Cancer Father        throat (smoker)  . Cancer Paternal Grandmother        throat (smoker)  . Stroke Neg Hx     Social History   Socioeconomic History  . Marital status: Single    Spouse name: Not on file  . Number of children: Not on file  . Years of education: Not on file  . Highest education level: Not on file  Occupational History  . Not on file  Tobacco Use  . Smoking status: Never Smoker  . Smokeless tobacco: Never Used  Substance and Sexual Activity  . Alcohol use: No  . Drug use: No  . Sexual activity: Yes  Other Topics Concern  . 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 Difficulty of Paying Living Expenses:   Food Insecurity:   . Worried About Charity fundraiser in the Last Year:   . Arboriculturist in the Last Year:   Transportation Needs:   . Film/video editor (Medical):   Marland Kitchen Lack of Transportation (Non-Medical):   Physical Activity:   . Days of Exercise per Week:   . Minutes of Exercise per Session:   Stress:   . Feeling of Stress :   Social Connections:   . Frequency of Communication with Friends and Family:   . Frequency of Social Gatherings with Friends and Family:   . Attends Religious Services:   . Active Member of Clubs or Organizations:   . Attends Archivist Meetings:   Marland Kitchen Marital Status:   Intimate Partner Violence:   . Fear of Current or Ex-Partner:   . Emotionally Abused:   Marland Kitchen Physically Abused:   .  Sexually Abused:      Constitutional: Denies fever, malaise, fatigue, headache or abrupt weight changes.  Respiratory: Denies difficulty breathing, shortness of breath, cough or sputum production.   Cardiovascular: Denies chest pain, chest tightness, palpitations or swelling in the hands or feet.  Musculoskeletal: Pt reports left arm pain. Denies decrease in range of motion, difficulty with gait, muscle pain or joint pain and swelling.  Skin: Pt reports rash of left arm. Denies ulcercations.  Neurological: Denies dizziness, difficulty with memory, difficulty with speech or problems with balance and coordination.    No other specific complaints in a complete review of systems (except as listed in HPI above).  Objective:   Physical Exam  BP (!) 144/90 (BP Location: Left Arm, Patient Position: Sitting, Cuff Size: Normal)   Pulse 71   Temp 97.9 F (36.6 C) (Oral)   Ht 5\' 5"  (1.651 m)   Wt 238 lb (108 kg)   LMP 10/28/2019   SpO2 99%   BMI 39.61 kg/m   Wt Readings from Last 3 Encounters:  10/09/19 242 lb (109.8 kg)  08/25/18 250 lb (113.4 kg)  03/12/14 272 lb 4 oz (123.5 kg)    General: Appears her stated age, obese, in NAD. Skin: Excoriation noted over left subacromial bursa and left lateral elbow. She has some maculopapular lesions noted over her left tricep. Cardiovascular: Normal rate and rhythm.  Pulmonary/Chest: Normal effort and positive vesicular breath sounds. No respiratory distress. No wheezes, rales or ronchi noted.  Musculoskeletal: Normal internal and external rotation of the left shoulder. Strength 5/5 BUE. Neurological: Alert and oriented. Coordination normal.    BMET    Component Value Date/Time   NA 139 08/25/2018 0902   K 3.9 08/25/2018 0902   CL 102 08/25/2018 0902   CO2 24 01/07/2014 0917   GLUCOSE 88 08/25/2018 0902   GLUCOSE 72 01/07/2014 0917   BUN 12 08/25/2018 0902   CREATININE 0.90 08/25/2018 0902   CALCIUM 9.4 08/25/2018 0902   GFRNONAA 78  08/25/2018 0902   GFRAA 90 08/25/2018 0902    Lipid Panel     Component Value Date/Time   CHOL 128 08/25/2018 0902   TRIG 47 08/25/2018 0902   HDL 55 08/25/2018 0902   CHOLHDL 2.3 08/25/2018 0902   CHOLHDL 3 01/07/2014 0917   VLDL 12.8 01/07/2014 0917   LDLCALC 64 08/25/2018 0902  CBC    Component Value Date/Time   WBC 3.9 08/25/2018 0902   WBC 5.0 01/07/2014 0917   RBC 4.51 08/25/2018 0902   RBC 4.28 01/07/2014 0917   HGB 11.6 08/25/2018 0902   HCT 35.2 08/25/2018 0902   PLT 256 08/25/2018 0902   MCV 78 (L) 08/25/2018 0902   MCH 25.7 (L) 08/25/2018 0902   MCHC 33.0 08/25/2018 0902   MCHC 32.3 01/07/2014 0917   RDW 15.6 (H) 08/25/2018 0902   LYMPHSABS 1.2 08/25/2018 0902   MONOABS 0.4 01/07/2014 0917   EOSABS 0.1 08/25/2018 0902   BASOSABS 0.0 08/25/2018 0902    Hgb A1C Lab Results  Component Value Date   HGBA1C 5.5 08/25/2018           Assessment & Plan:   Rash, Left Arm Pain:  Shingles vs allergic reaction Advised her if it is far out from onset, she could not be treated with antiviral meds Offered blood test for herpes zoster IgM, but she declines at this time Depo Medrol 80 mg IM today  Return precautions discussed Webb Silversmith, NP This visit occurred during the SARS-CoV-2 public health emergency.  Safety protocols were in place, including screening questions prior to the visit, additional usage of staff PPE, and extensive cleaning of exam room while observing appropriate contact time as indicated for disinfecting solutions.

## 2019-11-06 ENCOUNTER — Ambulatory Visit: Payer: PRIVATE HEALTH INSURANCE

## 2019-11-08 ENCOUNTER — Ambulatory Visit (INDEPENDENT_AMBULATORY_CARE_PROVIDER_SITE_OTHER): Payer: PRIVATE HEALTH INSURANCE

## 2019-11-08 DIAGNOSIS — E538 Deficiency of other specified B group vitamins: Secondary | ICD-10-CM

## 2019-11-08 MED ORDER — CYANOCOBALAMIN 1000 MCG/ML IJ SOLN
1000.0000 ug | Freq: Once | INTRAMUSCULAR | Status: AC
  Administered 2019-11-08: 1000 ug via INTRAMUSCULAR

## 2019-11-08 NOTE — Progress Notes (Signed)
Per orders of [facility], NP-C, injection of B12--left upper outer quandrant given by Lurlean Nanny.  Patient tolerated injection well.

## 2019-11-09 ENCOUNTER — Telehealth: Payer: Self-pay | Admitting: Internal Medicine

## 2019-11-09 NOTE — Telephone Encounter (Signed)
Patient called stated she received a call from her pharmacy that a b12 prescription was sent to them and ready for pick up   Patient stated she did not agree to this and wanted to know why this was called in for her

## 2019-11-12 NOTE — Telephone Encounter (Signed)
Left message on voicemail There is no order in the chart with Rx for B12 being sent to the pharmacy, so I am not sure how that was possible

## 2019-11-22 ENCOUNTER — Other Ambulatory Visit: Payer: Self-pay | Admitting: Internal Medicine

## 2019-11-22 ENCOUNTER — Other Ambulatory Visit: Payer: Self-pay

## 2019-11-22 ENCOUNTER — Telehealth: Payer: Self-pay | Admitting: Internal Medicine

## 2019-11-22 ENCOUNTER — Ambulatory Visit (INDEPENDENT_AMBULATORY_CARE_PROVIDER_SITE_OTHER): Payer: PRIVATE HEALTH INSURANCE

## 2019-11-22 ENCOUNTER — Telehealth: Payer: Self-pay | Admitting: *Deleted

## 2019-11-22 DIAGNOSIS — E538 Deficiency of other specified B group vitamins: Secondary | ICD-10-CM

## 2019-11-22 MED ORDER — AMLODIPINE BESYLATE 5 MG PO TABS: 5.0000 mg | ORAL_TABLET | Freq: Two times a day (BID) | ORAL | 0 refills | Status: DC

## 2019-11-22 MED ORDER — LISINOPRIL 10 MG PO TABS: 15.0000 mg | ORAL_TABLET | Freq: Every day | ORAL | 0 refills | Status: DC

## 2019-11-22 MED ORDER — HYDROCHLOROTHIAZIDE 25 MG PO TABS: 25.0000 mg | ORAL_TABLET | Freq: Every morning | ORAL | 0 refills | Status: DC

## 2019-11-22 MED ORDER — CYANOCOBALAMIN 1000 MCG/ML IJ SOLN
1000.0000 ug | Freq: Once | INTRAMUSCULAR | Status: AC
Start: 2019-11-22 — End: 2019-11-22
  Administered 2019-11-22: 1000 ug via INTRAMUSCULAR

## 2019-11-22 NOTE — Telephone Encounter (Signed)
She was told to schedule an appt for her annual exam which she has not done. I will refill for 90 days.

## 2019-11-22 NOTE — Telephone Encounter (Signed)
Good morning,   This patient receives her chronic meds of Amlodipine, Lisinopril, and HCTZ through our [facility] dispens[facility]. Our PA has bridged her refills since late last year as she was scheduled to be seen in your office in Dec 2020. However she kept cancelling and rescheduling until she was seen in April 2021. She was not taking her meds regularly at that time and the last refill we gave her should have only lasted until the beginning of May.   Could you please advise if you would like her to reinitiate her current meds of: Lisinopril 10mg  1 tab in AM and 1.5 tabs in PM Amlodipine 5mg  BID HCTZ 25mg  daily Or if you would like her to be seen in your office before restarting anything?  I can discuss with pt and inform her of your decision while she is here at work if needed.  Thank you, Cooper Render

## 2019-11-22 NOTE — Progress Notes (Signed)
Per orders of Webb Silversmith, NP, injection of B12 given by Randall An. Patient tolerated injection well.

## 2019-11-22 NOTE — Telephone Encounter (Signed)
Rx sent through [facility]  

## 2019-11-22 NOTE — Telephone Encounter (Signed)
[facility] called in regards to the lisinopril (ZESTRIL) 10 MG tablet  It was sent for 90 tablets.   They were wondering if this script was suppose to be for a 90 day supply, 135 tablets.

## 2019-11-23 NOTE — Telephone Encounter (Signed)
Got it pulled. Thank you! I will remind Cassie Mills to get her CPE scheduled for within the next 90 days.

## 2019-11-23 NOTE — Telephone Encounter (Signed)
Noted  

## 2019-11-27 ENCOUNTER — Telehealth: Payer: Self-pay | Admitting: Registered Nurse

## 2019-11-27 ENCOUNTER — Encounter: Payer: Self-pay | Admitting: Registered Nurse

## 2019-11-27 DIAGNOSIS — R5383 Other fatigue: Secondary | ICD-10-CM

## 2019-11-27 DIAGNOSIS — I10 Essential (primary) hypertension: Secondary | ICD-10-CM

## 2019-11-27 NOTE — Telephone Encounter (Signed)
Patient asking how long it should take for B12 shots to work.  She is still feeling fatigued.  Discussed her usual sleep hygiene bed 11pm up at 0400.  Discussed chronic sleep deprivation if not getting 7-8 hours per sleep on a regular basis.  Discussed I would check her labs when I returned to my workstation and discuss with her further later this week as clinic closed tomorrow and my shift ended 20 minutes ago.  Discussed if her alarm waking her up or waking up naturally every am and she stated alarm.  Patient also reported had run out of her blood pressure medication lisinopril earlier this week/couple days ago could also contribute to her not feeling well.  Patient verbalized understanding information/instructions, agreed with plan of care and had no further questions at this time.

## 2019-11-27 NOTE — Telephone Encounter (Signed)
Patient reported to me ran out of lisinopril a couple days ago had been taking lisinopril 10mg  take 1.5 tablets daily #135 RF0 requesting refill from [facility] (didn't tolerate 20mg  po daily); along with amlodipine 5mg  po bid #180 RF0 and hydrochlorothiazide 25mg  po daily #90 RF0 also dispensed to patient from [facility] today.  Patient reminded she needs to schedule follow up appt with [facility] overdue and that new Rx needed for future refills at [facility].  Patient verbalized understanding information/instructions,a greed with plan of care and had no further questions at this time.

## 2019-11-27 NOTE — Telephone Encounter (Signed)
Lab results per epic in previous year  B12 level normal April 2021 but last CBC 2020 will discuss with patient to schedule nonfasting labs as had TSH already this year normal also.  Vitamin D low and on 50,000 supplemental therapy per medication list. Results for Cassie Mills, Cassie Mills (MRN [id number]) as of 11/27/2019 21:25  Ref. Range 08/25/2018 09:02 10/09/2019 16:01  Sodium Latest Ref Range: 134 - 144 mmol/L 139   Potassium Latest Ref Range: 3.5 - 5.2 mmol/L 3.9   Chloride Latest Ref Range: 96 - 106 mmol/L 102   Glucose Latest Ref Range: 65 - 99 mg/dL 88   BUN Latest Ref Range: 6 - 24 mg/dL 12   Creatinine Latest Ref Range: 0.57 - 1.00 mg/dL 0.90   Calcium Latest Ref Range: 8.7 - 10.2 mg/dL 9.4   BUN/Creatinine Ratio Latest Ref Range: 9 - 23  13   Phosphorus Latest Ref Range: 3.0 - 4.3 mg/dL 3.7   Alkaline Phosphatase Latest Ref Range: 39 - 117 IU/L 74   Albumin Latest Ref Range: 3.8 - 4.8 g/dL 4.1   Albumin/Globulin Ratio Latest Ref Range: 1.2 - 2.2  1.5   Uric Acid Latest Ref Range: 2.5 - 7.1 mg/dL 5.2   AST Latest Ref Range: 0 - 40 IU/L 18   ALT Latest Ref Range: 0 - 32 IU/L 7   Total Protein Latest Ref Range: 6.0 - 8.5 g/dL 6.8   Total Bilirubin Latest Ref Range: 0.0 - 1.2 mg/dL 0.2   GGT Latest Ref Range: 0 - 60 IU/L 12   GFR, Est Non African American Latest Ref Range: >59 mL/min/1.73 78   GFR, Est African American Latest Ref Range: >59 mL/min/1.73 90   Estimated CHD Risk Latest Ref Range: 0.0 - 1.0 times avg. < 0.5   LDH Latest Ref Range: 119 - 226 IU/L 271 (H)   Total CHOL/HDL Ratio Latest Ref Range: 0.0 - 4.4 ratio 2.3   Cholesterol, Total Latest Ref Range: 100 - 199 mg/dL 128   HDL Cholesterol Latest Ref Range: >39 mg/dL 55   LDL (calc) Latest Ref Range: 0 - 99 mg/dL 64   Triglycerides Latest Ref Range: 0 - 149 mg/dL 47   VLDL Cholesterol Cal Latest Ref Range: 5 - 40 mg/dL 9   Iron Latest Ref Range: 27 - 159 ug/dL 25 (L)   VITD Latest Ref Range: 30.00 - 100.00 ng/mL  9.82 (L)   Vitamin B12 Latest Ref Range: 211 - 911 pg/mL  252  Globulin, Total Latest Ref Range: 1.5 - 4.5 g/dL 2.7   WBC Latest Ref Range: 3.4 - 10.8 x10E3/uL 3.9   RBC Latest Ref Range: 3.77 - 5.28 x10E6/uL 4.51   Hemoglobin Latest Ref Range: 11.1 - 15.9 g/dL 11.6   HCT Latest Ref Range: 34.0 - 46.6 % 35.2   MCV Latest Ref Range: 79 - 97 fL 78 (L)   MCH Latest Ref Range: 26.6 - 33.0 pg 25.7 (L)   MCHC Latest Ref Range: 31.5 - 35.7 g/dL 33.0   RDW Latest Ref Range: 11.7 - 15.4 % 15.6 (H)   Platelets Latest Ref Range: 150 - 450 x10E3/uL 256   Neutrophils Latest Ref Range: Not Estab. % 59   Immature Granulocytes Latest Ref Range: Not Estab. % 0   NEUT# Latest Ref Range: 1.4 - 7.0 x10E3/uL 2.3   Lymphocyte # Latest Ref Range: 0.7 - 3.1 x10E3/uL 1.2   Monocytes Absolute Latest Ref Range: 0.1 - 0.9 x10E3/uL 0.3   Basophils Absolute  Latest Ref Range: 0.0 - 0.2 x10E3/uL 0.0   Immature Grans (Abs) Latest Ref Range: 0.0 - 0.1 x10E3/uL 0.0   Lymphs Latest Ref Range: Not Estab. % 31   Monocytes Latest Ref Range: Not Estab. % 7   Basos Latest Ref Range: Not Estab. % 1   Eos Latest Ref Range: Not Estab. % 2   EOS (ABSOLUTE) Latest Ref Range: 0.0 - 0.4 x10E3/uL 0.1   Hemoglobin A1C Latest Ref Range: 4.8 - 5.6 % 5.5   TSH Latest Ref Range: 0.35 - 4.50 uIU/mL 0.996 0.81  Thyroxine (T4) Latest Ref Range: 4.5 - 12.0 ug/dL 9.3   Free Thyroxine Index Latest Ref Range: 1.2 - 4.9  2.0   T3 Uptake Ratio Latest Ref Range: 24 - 39 % 22 (L)

## 2019-12-11 ENCOUNTER — Ambulatory Visit: Payer: PRIVATE HEALTH INSURANCE

## 2019-12-13 ENCOUNTER — Ambulatory Visit (INDEPENDENT_AMBULATORY_CARE_PROVIDER_SITE_OTHER): Payer: PRIVATE HEALTH INSURANCE

## 2019-12-13 ENCOUNTER — Other Ambulatory Visit: Payer: Self-pay

## 2019-12-13 DIAGNOSIS — E538 Deficiency of other specified B group vitamins: Secondary | ICD-10-CM | POA: Diagnosis not present

## 2019-12-13 MED ORDER — CYANOCOBALAMIN 1000 MCG/ML IJ SOLN
1000.0000 ug | Freq: Once | INTRAMUSCULAR | Status: AC
  Administered 2019-12-13: 1000 ug via INTRAMUSCULAR

## 2019-12-13 NOTE — Progress Notes (Signed)
Per orders of Dr. Cody, injection of vit B12 given by Kayler Buckholtz. Patient tolerated injection well.  

## 2019-12-27 ENCOUNTER — Ambulatory Visit (INDEPENDENT_AMBULATORY_CARE_PROVIDER_SITE_OTHER): Payer: PRIVATE HEALTH INSURANCE

## 2019-12-27 ENCOUNTER — Other Ambulatory Visit: Payer: Self-pay

## 2019-12-27 DIAGNOSIS — E538 Deficiency of other specified B group vitamins: Secondary | ICD-10-CM | POA: Diagnosis not present

## 2019-12-27 MED ORDER — CYANOCOBALAMIN 1000 MCG/ML IJ SOLN
1000.0000 ug | Freq: Once | INTRAMUSCULAR | Status: AC
  Administered 2019-12-27: 1000 ug via INTRAMUSCULAR

## 2019-12-27 NOTE — Progress Notes (Signed)
Per orders of Webb Silversmith, NP injection of vit V03 given by Brenton Grills. Patient tolerated injection well.

## 2020-01-15 ENCOUNTER — Other Ambulatory Visit: Payer: Self-pay

## 2020-01-15 ENCOUNTER — Ambulatory Visit: Payer: Self-pay | Admitting: *Deleted

## 2020-01-15 VITALS — BP 138/102 | HR 71

## 2020-01-15 DIAGNOSIS — I10 Essential (primary) hypertension: Secondary | ICD-10-CM

## 2020-01-15 NOTE — Progress Notes (Signed)
Has not taken BP meds today. Forgot. Last took yesterday AM. Missed a couple of days last week as well.

## 2020-01-24 ENCOUNTER — Ambulatory Visit: Payer: PRIVATE HEALTH INSURANCE

## 2020-01-26 ENCOUNTER — Telehealth: Payer: Self-pay | Admitting: Registered Nurse

## 2020-01-26 ENCOUNTER — Encounter: Payer: Self-pay | Admitting: Registered Nurse

## 2020-01-26 NOTE — Telephone Encounter (Signed)
Epic reminder patient overdue for annual fasting labs.  Low on iron, vitamin D and MCV/MCH and elevated RDW.  Please draw Hgba1c also.  Last BP check 01/15/2020 with RN Hildred Alamin and patient had not taken her medication for hypertension BP 138/102 and pulse 71.  Last appt with PCM 09 Oct 2019 TSH, Vitamin D and B12 level checked due to fatigue.  PCM note states yearly iron and CBC for iron deficiency anemia.  Last CBC and Hgba1c 08/25/2018 per Epic.

## 2020-01-30 ENCOUNTER — Ambulatory Visit: Payer: PRIVATE HEALTH INSURANCE

## 2020-02-07 ENCOUNTER — Ambulatory Visit (INDEPENDENT_AMBULATORY_CARE_PROVIDER_SITE_OTHER): Payer: PRIVATE HEALTH INSURANCE

## 2020-02-07 ENCOUNTER — Other Ambulatory Visit: Payer: Self-pay

## 2020-02-07 DIAGNOSIS — E538 Deficiency of other specified B group vitamins: Secondary | ICD-10-CM | POA: Diagnosis not present

## 2020-02-07 MED ORDER — CYANOCOBALAMIN 1000 MCG/ML IJ SOLN
1000.0000 ug | Freq: Once | INTRAMUSCULAR | Status: AC
  Administered 2020-02-07: 1000 ug via INTRAMUSCULAR

## 2020-02-07 NOTE — Progress Notes (Signed)
Per orders of Webb Silversmith, NP, injection of vit V61 given by Brenton Grills. Patient tolerated injection well.

## 2020-02-15 ENCOUNTER — Encounter: Payer: Self-pay | Admitting: Registered Nurse

## 2020-02-15 ENCOUNTER — Telehealth: Payer: Self-pay | Admitting: Registered Nurse

## 2020-02-15 NOTE — Telephone Encounter (Signed)
Epic reminding me patient has not had ordered female exec panel (last Mar 2020) and repeat Vit D as 9.82 on 10/09/2019 started higher dose supplementation.  Please verify with patient no other labs drawn offsite.  Please verify with patient if covid vaccination also.  Please schedule patient female exec panel and Vitamin D if she has not received anywhere else.

## 2020-03-07 ENCOUNTER — Telehealth: Payer: Self-pay | Admitting: Registered Nurse

## 2020-03-07 ENCOUNTER — Encounter: Payer: Self-pay | Admitting: Registered Nurse

## 2020-03-07 NOTE — Telephone Encounter (Signed)
Late entry  RN Hildred Alamin notified me patient had not responded to her voicemail or emails to schedule labs and verify if covid vaccinated.    Epic reminding me patient has not had completed fasting female exec panel (last Mar 2020) and repeat Vit D as 9.82 on 10/09/2019 started higher dose supplementation.  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  She has not had these labs down only B12  Discussed fasting labs required and she had eaten lunch today so will need to schedule next week with RN Hildred Alamin.  Clinic closed tomorrow 03/07/2020  Patient reported she has been working overtime at work and busy.  Patient agreed with plan of care and had no further questions at this time.   Please schedule patient female exec panel and Vitamin D repeat to see if improving on her new supplement dose.

## 2020-03-14 ENCOUNTER — Ambulatory Visit: Payer: Self-pay | Admitting: *Deleted

## 2020-03-14 ENCOUNTER — Other Ambulatory Visit: Payer: Self-pay

## 2020-03-14 VITALS — BP 148/106 | HR 57

## 2020-03-14 DIAGNOSIS — I10 Essential (primary) hypertension: Secondary | ICD-10-CM

## 2020-03-14 DIAGNOSIS — Z0000 Encounter for general adult medical examination without abnormal findings: Secondary | ICD-10-CM

## 2020-03-14 DIAGNOSIS — E559 Vitamin D deficiency, unspecified: Secondary | ICD-10-CM

## 2020-03-14 NOTE — Telephone Encounter (Signed)
Noted labs drawn and pending results; send out to [facility]

## 2020-03-14 NOTE — Progress Notes (Signed)
Labs per NP orders. BP noted to be elevated today. She reports missing yesterday's meds, but took today at 73. BP check at 0945. She denies any HTN sx. Currently taking Amlodipine 5mg  daily, HCTZ 25mg  daily, Lisinopril 10mg  daily for HTN.  Discussed with pt that RN would notify NP of BP and we would likely restart the weekly BP checks and then refer her to her pcp for dose adjustments. Pt verbalizes agreement.

## 2020-03-14 NOTE — Telephone Encounter (Signed)
Exec, A1c, and Vit D drawn this morning. A1c was ordered in June 2021 by NP.

## 2020-03-15 LAB — CMP12+LP+TP+TSH+6AC+CBC/D/PLT
ALT: 13 IU/L (ref 0–32)
AST: 18 IU/L (ref 0–40)
Albumin/Globulin Ratio: 1.8 (ref 1.2–2.2)
Albumin: 4.5 g/dL (ref 3.8–4.8)
Alkaline Phosphatase: 84 IU/L (ref 44–121)
BUN/Creatinine Ratio: 12 (ref 9–23)
BUN: 11 mg/dL (ref 6–24)
Basophils Absolute: 0 10*3/uL (ref 0.0–0.2)
Basos: 1 %
Bilirubin Total: 0.4 mg/dL (ref 0.0–1.2)
Calcium: 9.5 mg/dL (ref 8.7–10.2)
Chloride: 98 mmol/L (ref 96–106)
Chol/HDL Ratio: 2.1 ratio (ref 0.0–4.4)
Cholesterol, Total: 158 mg/dL (ref 100–199)
Creatinine, Ser: 0.91 mg/dL (ref 0.57–1.00)
EOS (ABSOLUTE): 0.1 10*3/uL (ref 0.0–0.4)
Eos: 3 %
Estimated CHD Risk: <0.5 times avg. (ref 0.0–1.0)
Free Thyroxine Index: 2.1 (ref 1.2–4.9)
GFR calc Af Amer: 88 mL/min/{1.73_m2} (ref >59)
GFR calc non Af Amer: 76 mL/min/{1.73_m2} (ref >59)
GGT: 13 IU/L (ref 0–60)
Globulin, Total: 2.5 g/dL (ref 1.5–4.5)
Glucose: 85 mg/dL (ref 65–99)
HDL: 74 mg/dL (ref >39)
Hematocrit: 38.1 % (ref 34.0–46.6)
Hemoglobin: 12.3 g/dL (ref 11.1–15.9)
Immature Grans (Abs): 0 10*3/uL (ref 0.0–0.1)
Immature Granulocytes: 0 %
Iron: 54 ug/dL (ref 27–159)
LDH: 295 IU/L — ABNORMAL HIGH (ref 119–226)
LDL Chol Calc (NIH): 75 mg/dL (ref 0–99)
Lymphocytes Absolute: 1.1 10*3/uL (ref 0.7–3.1)
Lymphs: 29 %
MCH: 24.8 pg — ABNORMAL LOW (ref 26.6–33.0)
MCHC: 32.3 g/dL (ref 31.5–35.7)
MCV: 77 fL — ABNORMAL LOW (ref 79–97)
Monocytes Absolute: 0.3 10*3/uL (ref 0.1–0.9)
Monocytes: 8 %
Neutrophils Absolute: 2.3 10*3/uL (ref 1.4–7.0)
Neutrophils: 59 %
Phosphorus: 4 mg/dL (ref 3.0–4.3)
Platelets: 221 10*3/uL (ref 150–450)
Potassium: 3.7 mmol/L (ref 3.5–5.2)
RBC: 4.96 x10E6/uL (ref 3.77–5.28)
RDW: 19.8 % — ABNORMAL HIGH (ref 11.7–15.4)
Sodium: 139 mmol/L (ref 134–144)
T3 Uptake Ratio: 23 % — ABNORMAL LOW (ref 24–39)
T4, Total: 9.3 ug/dL (ref 4.5–12.0)
TSH: 1.05 u[IU]/mL (ref 0.450–4.500)
Total Protein: 7 g/dL (ref 6.0–8.5)
Triglycerides: 42 mg/dL (ref 0–149)
Uric Acid: 6.1 mg/dL (ref 2.6–6.2)
VLDL Cholesterol Cal: 9 mg/dL (ref 5–40)
WBC: 3.9 10*3/uL (ref 3.4–10.8)

## 2020-03-15 LAB — VITAMIN D 25 HYDROXY (VIT D DEFICIENCY, FRACTURES): Vit D, 25-Hydroxy: 22.1 ng/mL — ABNORMAL LOW (ref 30.0–100.0)

## 2020-03-15 LAB — HGB A1C W/O EAG: Hgb A1c MFr Bld: 5.5 % (ref 4.8–5.6)

## 2020-03-17 NOTE — Telephone Encounter (Signed)
Follow up with PCM elevated LDH (probably due to weight/chronic health conditions) & RDW (worsening), low Vitamin D (improving), T3 (improving); MCV, MCHC (worsening).  Please give Vitamin D, DASH diet handouts.  I recommend weight loss, exercise 150 minutes per week; dietary fiber daily by mouth 20 grams women per up to date; eat whole grains/fruits/vegetables; keep added sugars to less than 100 calories/ 5 teaspoons for women per American Heart Association; blood sugar (spot/Hgba1c), electrolytes, iron, kidney/liver function.  Anemia resolved iron normal but size of red blood cells still not normal

## 2020-03-21 NOTE — Telephone Encounter (Signed)
Per RN Hildred Alamin patient missed lab results appt earlier this week.  She will contact her to reschedule.

## 2020-03-25 ENCOUNTER — Ambulatory Visit (INDEPENDENT_AMBULATORY_CARE_PROVIDER_SITE_OTHER): Payer: PRIVATE HEALTH INSURANCE

## 2020-03-25 ENCOUNTER — Other Ambulatory Visit: Payer: Self-pay

## 2020-03-25 DIAGNOSIS — E538 Deficiency of other specified B group vitamins: Secondary | ICD-10-CM

## 2020-03-25 MED ORDER — CYANOCOBALAMIN 1000 MCG/ML IJ SOLN
1000.0000 ug | Freq: Once | INTRAMUSCULAR | Status: AC
  Administered 2020-03-25: 1000 ug via INTRAMUSCULAR

## 2020-03-25 NOTE — Progress Notes (Signed)
Per orders of Webb Silversmith NP, injection of vitamin b12 given by Kem Parkinson. Patient tolerated injection well.

## 2020-03-27 NOTE — Telephone Encounter (Signed)
RN Hildred Alamin notified me today results review was completed with patient and patient had no further questions.

## 2020-04-01 ENCOUNTER — Telehealth: Payer: Self-pay | Admitting: Registered Nurse

## 2020-04-01 DIAGNOSIS — R7402 Elevation of levels of lactic acid dehydrogenase (LDH): Secondary | ICD-10-CM

## 2020-04-01 DIAGNOSIS — R7989 Other specified abnormal findings of blood chemistry: Secondary | ICD-10-CM

## 2020-04-01 NOTE — Telephone Encounter (Signed)
Patient with questions on her lab results.  Discussed vitamin D serum level improved from 9 to 22 but still below normal recommended to follow up with her PCM/continue oral supplementation especially with colder temps/less sunlight hours fall/winter.  T3 update still slightly low but improved almost normal.  Discussed chronic disease (hypertension, obesity) probable cause.  LDH elevated slightly worsened from previous.  Discussed chronic inflammation in body/chronic disease typically cause of worsening LDH.  Due to work shifts/rushing in morning patient sometimes forgets to take her daily medications.  Encouraged her to carry some doses in her purse in a pill minder for those days she wakes up late and rushing to not be late for work she can take medicine when she remembers at work.  Reiterated follow up with PCM elevated LDH & RDW (variability in size of Red Blood Cells worsening ensure taking her iron and getting B12 shots), low Vitamin D (improving), T3 (improving); MCV, MCHC (related to size and number of Red Blood cells worsening).  I recommended weight loss, exercise 150 minutes per week; dietary fiber daily by mouth 20 grams women per up to date; eat whole grains/fruits/vegetables; keep added sugars to less than 100 calories/ 5 teaspoons for women per American Heart Association; blood sugar (spot/Hgba1c), cholesterol, electrolytes, iron, kidney/liver function. Anemia resolved iron normal but size of red blood cells still not normal.  White Blood cell count normal no infection.   Wished patient a happy early birthday.  Patient verbalized understanding of information/instructions and stated it was helpful to talk about lab results in more detail today.

## 2020-04-29 DIAGNOSIS — E538 Deficiency of other specified B group vitamins: Secondary | ICD-10-CM

## 2020-05-05 NOTE — Progress Notes (Signed)
A user error has taken place: encounter opened in error, closed for administrative reasons.

## 2020-05-20 ENCOUNTER — Telehealth: Payer: Self-pay | Admitting: Registered Nurse

## 2020-05-20 ENCOUNTER — Encounter: Payer: Self-pay | Admitting: Registered Nurse

## 2020-05-20 MED ORDER — LISINOPRIL 10 MG PO TABS: 15.0000 mg | ORAL_TABLET | Freq: Every day | ORAL | 0 refills | Status: DC

## 2020-05-20 MED ORDER — HYDROCHLOROTHIAZIDE 25 MG PO TABS: 25.0000 mg | ORAL_TABLET | Freq: Every morning | ORAL | 0 refills | Status: DC

## 2020-05-20 MED ORDER — AMLODIPINE BESYLATE 5 MG PO TABS: 5.0000 mg | ORAL_TABLET | Freq: Two times a day (BID) | ORAL | 0 refills | Status: DC

## 2020-05-21 NOTE — Telephone Encounter (Signed)
Late entry bridge refill hypertension medications ran out patient requested refill.  BP check with RN Hildred Alamin on pick up of medications today from clinic filled through [facility] 90 day supply each amlodipine 5mg  po BID #180 RF0, lisinopril 15mg  po daily #180 10mg  tabs take 1.5 tabs daily RF0 and hydrochlorothiazide 25mg  po daily #90 RF0.  PCM appt scheduled per patient to follow up also.

## 2020-05-22 ENCOUNTER — Ambulatory Visit: Payer: PRIVATE HEALTH INSURANCE | Admitting: Internal Medicine

## 2020-05-22 NOTE — Progress Notes (Deleted)
Subjective:    Patient ID: Cassie Mills, female    DOB: 1975/11/07, 44 y.o.   MRN: [id number]  HPI  Pt presents to the clinic today to discuss her weight loss options. Her current weight is lbs with a BMI of.   Review of Systems      Past Medical History:  Diagnosis Date  . Anemia   . Childhood asthma   . Heart murmur   . History of chicken pox 1984  . Hypertension   . Menorrhagia    due to fibroids  . Uterine fibroid     Current Outpatient Medications  Medication Sig Dispense Refill  . amLODipine (NORVASC) 5 MG tablet Take 1 tablet (5 mg total) by mouth 2 (two) times daily. 180 tablet 0  . ferrous sulfate 325 (65 FE) MG tablet Take 325 mg by mouth daily with breakfast.    . hydrochlorothiazide (HYDRODIURIL) 25 MG tablet Take 1 tablet (25 mg total) by mouth every morning. 90 tablet 0  . lisinopril (ZESTRIL) 10 MG tablet Take 1.5 tablets (15 mg total) by mouth daily. 180 tablet 0  . Vitamin D, Ergocalciferol, (DRISDOL) 1.25 MG (50000 UNIT) CAPS capsule Take 1 capsule (50,000 Units total) by mouth every 7 (seven) days. 12 capsule 0   No current facility-administered medications for this visit.    No Known Allergies  Family History  Problem Relation Age of Onset  . Arthritis Mother   . Hypertension Mother   . Kidney disease Mother   . Diabetes Mother   . Diabetes Maternal Aunt   . Kidney disease Maternal Aunt   . Diabetes Maternal Uncle   . Kidney disease Maternal Uncle   . Heart failure Paternal Aunt   . CAD Paternal Uncle   . Cancer Father        throat (smoker)  . Cancer Paternal Grandmother        throat (smoker)  . Stroke Neg Hx     Social History   Socioeconomic History  . Marital status: Single    Spouse name: Not on file  . Number of children: Not on file  . Years of education: Not on file  . Highest education level: Not on file  Occupational History  . Not on file  Tobacco Use  . Smoking status: Never Smoker  . Smokeless tobacco: Never  Used  Substance and Sexual Activity  . Alcohol use: No  . Drug use: No  . Sexual activity: Yes  Other Topics Concern  . 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 Difficulty of Paying Living Expenses: Not on file  Food Insecurity:   . Worried About Charity fundraiser in the Last Year: Not on file  . Ran Out of Food in the Last Year: Not on file  Transportation Needs:   . Lack of Transportation (Medical): Not on file  . Lack of Transportation (Non-Medical): Not on file  Physical Activity:   . Days of Exercise per Week: Not on file  . Minutes of Exercise per Session: Not on file  Stress:   . Feeling of Stress : Not on file  Social Connections:   . Frequency of Communication with Friends and Family: Not on file  .  Frequency of Social Gatherings with Friends and Family: Not on file  . Attends Religious Services: Not on file  . Active Member of Clubs or Organizations: Not on file  . Attends Archivist Meetings: Not on file  . Marital Status: Not on file  Intimate Partner Violence:   . Fear of Current or Ex-Partner: Not on file  . Emotionally Abused: Not on file  . Physically Abused: Not on file  . Sexually Abused: Not on file     Constitutional: Denies fever, malaise, fatigue, headache or abrupt weight changes.  HEENT: Denies eye pain, eye redness, ear pain, ringing in the ears, wax buildup, runny nose, nasal congestion, bloody nose, or sore throat. Respiratory: Denies difficulty breathing, shortness of breath, cough or sputum production.   Cardiovascular: Denies chest pain, chest tightness, palpitations or swelling in the hands or feet.  Gastrointestinal: Denies abdominal pain, bloating, constipation, diarrhea or blood in the stool.  GU: Denies  urgency, frequency, pain with urination, burning sensation, blood in urine, odor or discharge. Musculoskeletal: Denies decrease in range of motion, difficulty with gait, muscle pain or joint pain and swelling.  Skin: Denies redness, rashes, lesions or ulcercations.  Neurological: Denies dizziness, difficulty with memory, difficulty with speech or problems with balance and coordination.  Psych: Denies anxiety, depression, SI/HI.  No other specific complaints in a complete review of systems (except as listed in HPI above).  Objective:   Physical Exam There were no vitals taken for this visit. Wt Readings from Last 3 Encounters:  11/01/19 238 lb (108 kg)  10/09/19 242 lb (109.8 kg)  08/25/18 250 lb (113.4 kg)    General: Appears their stated age, well developed, well nourished in NAD. Skin: Warm, dry and intact. No rashes, lesions or ulcerations noted. HEENT: Head: normal shape and size; Eyes: sclera white, no icterus, conjunctiva pink, PERRLA and EOMs intact; Ears: Tm's gray and intact, normal light reflex; Nose: mucosa pink and moist, septum midline; Throat/Mouth: Teeth present, mucosa pink and moist, no exudate, lesions or ulcerations noted.  Neck:  Neck supple, trachea midline. No masses, lumps or thyromegaly present.  Cardiovascular: Normal rate and rhythm. S1,S2 noted.  No murmur, rubs or gallops noted. No JVD or BLE edema. No carotid bruits noted. Pulmonary/Chest: Normal effort and positive vesicular breath sounds. No respiratory distress. No wheezes, rales or ronchi noted.  Abdomen: Soft and nontender. Normal bowel sounds. No distention or masses noted. Liver, spleen and kidneys non palpable. Musculoskeletal: Normal range of motion. No signs of joint swelling. No difficulty with gait.  Neurological: Alert and oriented. Cranial nerves II-XII grossly intact. Coordination normal.  Psychiatric: Mood and affect normal. Behavior is normal. Judgment and thought content normal.    EKG:  BMET    Component Value Date/Time   NA 139 03/14/2020 0949   K 3.7 03/14/2020 0949   CL 98 03/14/2020 0949   CO2 24 01/07/2014 0917   GLUCOSE 85 03/14/2020 0949   GLUCOSE 72 01/07/2014 0917   BUN 11 03/14/2020 0949   CREATININE 0.91 03/14/2020 0949   CALCIUM 9.5 03/14/2020 0949   GFRNONAA 76 03/14/2020 0949   GFRAA 88 03/14/2020 0949    Lipid Panel     Component Value Date/Time   CHOL 158 03/14/2020 0949   TRIG 42 03/14/2020 0949   HDL 74 03/14/2020 0949   CHOLHDL 2.1 03/14/2020 0949   CHOLHDL 3 01/07/2014 0917   VLDL 12.8 01/07/2014 0917   LDLCALC 75 03/14/2020 0949    CBC  Component Value Date/Time   WBC 3.9 03/14/2020 0949   WBC 5.0 01/07/2014 0917   RBC 4.96 03/14/2020 0949   RBC 4.28 01/07/2014 0917   HGB 12.3 03/14/2020 0949   HCT 38.1 03/14/2020 0949   PLT 221 03/14/2020 0949   MCV 77 (L) 03/14/2020 0949   MCH 24.8 (L) 03/14/2020 0949   MCHC 32.3 03/14/2020 0949   MCHC 32.3 01/07/2014 0917   RDW 19.8 (H) 03/14/2020 0949   LYMPHSABS 1.1 03/14/2020 0949   MONOABS 0.4 01/07/2014 0917   EOSABS 0.1 03/14/2020 0949   BASOSABS 0.0 03/14/2020 0949    Hgb A1C Lab Results  Component Value Date   HGBA1C 5.5 03/14/2020             Assessment & Plan:    Webb Silversmith, NP This visit occurred during the SARS-CoV-2 public health emergency.  Safety protocols were in place, including screening questions prior to the visit, additional usage of staff PPE, and extensive cleaning of exam room while observing appropriate contact time as indicated for disinfecting solutions.

## 2020-06-03 ENCOUNTER — Other Ambulatory Visit: Payer: Self-pay

## 2020-06-03 ENCOUNTER — Ambulatory Visit: Payer: PRIVATE HEALTH INSURANCE | Admitting: Internal Medicine

## 2020-06-03 ENCOUNTER — Ambulatory Visit (INDEPENDENT_AMBULATORY_CARE_PROVIDER_SITE_OTHER): Payer: PRIVATE HEALTH INSURANCE

## 2020-06-03 ENCOUNTER — Encounter: Payer: Self-pay | Admitting: Internal Medicine

## 2020-06-03 VITALS — BP 136/88 | HR 74 | Temp 97.6°F | Ht 65.0 in | Wt 221.0 lb

## 2020-06-03 DIAGNOSIS — R635 Abnormal weight gain: Secondary | ICD-10-CM

## 2020-06-03 DIAGNOSIS — E538 Deficiency of other specified B group vitamins: Secondary | ICD-10-CM

## 2020-06-03 MED ORDER — CYANOCOBALAMIN 1000 MCG/ML IJ SOLN
1000.0000 ug | Freq: Once | INTRAMUSCULAR | Status: AC
  Administered 2020-06-03: 15:00:00 1000 ug via INTRAMUSCULAR

## 2020-06-03 NOTE — Patient Instructions (Signed)
Stick to a 1400 calorie restricted diet daily Diet should consist mainly of protein and veggies Avoid processed, fried foods, carbs and sugary vegetables Do not drink your calories Eat every 3 hours to keep metabolism burning Consider Contrave

## 2020-06-03 NOTE — Progress Notes (Signed)
Subjective:    Patient ID: Cassie Mills, female    DOB: Feb 02, 1976, 44 y.o.   MRN: [id number]  HPI  Pt presents to the clinic today to discuss weight loss options. Her current weight today 221 lbs is with a BMI of 36.78. Her co morbidities include HTN. Her last A1C was 5.5%.  Review of Systems  Past Medical History:  Diagnosis Date  . Anemia   . Childhood asthma   . Heart murmur   . History of chicken pox 1984  . Hypertension   . Menorrhagia    due to fibroids  . Uterine fibroid     Current Outpatient Medications  Medication Sig Dispense Refill  . amLODipine (NORVASC) 5 MG tablet Take 1 tablet (5 mg total) by mouth 2 (two) times daily. 180 tablet 0  . ferrous sulfate 325 (65 FE) MG tablet Take 325 mg by mouth daily with breakfast.    . hydrochlorothiazide (HYDRODIURIL) 25 MG tablet Take 1 tablet (25 mg total) by mouth every morning. 90 tablet 0  . lisinopril (ZESTRIL) 10 MG tablet Take 1.5 tablets (15 mg total) by mouth daily. 180 tablet 0  . Vitamin D, Ergocalciferol, (DRISDOL) 1.25 MG (50000 UNIT) CAPS capsule Take 1 capsule (50,000 Units total) by mouth every 7 (seven) days. 12 capsule 0   No current facility-administered medications for this visit.    No Known Allergies  Family History  Problem Relation Age of Onset  . Arthritis Mother   . Hypertension Mother   . Kidney disease Mother   . Diabetes Mother   . Diabetes Maternal Aunt   . Kidney disease Maternal Aunt   . Diabetes Maternal Uncle   . Kidney disease Maternal Uncle   . Heart failure Paternal Aunt   . CAD Paternal Uncle   . Cancer Father        throat (smoker)  . Cancer Paternal Grandmother        throat (smoker)  . Stroke Neg Hx     Social History   Socioeconomic History  . Marital status: Single    Spouse name: Not on file  . Number of children: Not on file  . Years of education: Not on file  . Highest education level: Not on file  Occupational History  . Not on file  Tobacco Use   . Smoking status: Never Smoker  . Smokeless tobacco: Never Used  Substance and Sexual Activity  . Alcohol use: No  . Drug use: No  . Sexual activity: Yes  Other Topics Concern  . 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  Respiratory: Denies difficulty breathing, shortness of breath, cough or sputum production.   Cardiovascular: Denies chest pain, chest tightness, palpitations or swelling in the hands or feet.  Neurological: Denies dizziness, difficulty with memory, difficulty with speech or problems with balance and coordination.    No other specific complaints in a complete review of systems (except as  listed in HPI above).     Objective:   Physical Exam BP 136/88   Pulse 74   Temp 97.6 F (36.4 C) (Temporal)   Ht 5\' 5"  (1.651 m)   Wt 221 lb (100.2 kg)   BMI 36.78 kg/m   Wt Readings from Last 3 Encounters:  11/01/19 238 lb (108 kg)  10/09/19 242 lb (109.8 kg)  08/25/18 250 lb (113.4 kg)    General: Appears her stated age, obese, in NAD. Cardiovascular: Normal rate. Pulmonary/Chest: Normal effort. Neurological: Alert and oriented.   BMET    Component Value Date/Time   NA 139 03/14/2020 0949   K 3.7 03/14/2020 0949   CL 98 03/14/2020 0949   CO2 24 01/07/2014 0917   GLUCOSE 85 03/14/2020 0949   GLUCOSE 72 01/07/2014 0917   BUN 11 03/14/2020 0949   CREATININE 0.91 03/14/2020 0949   CALCIUM 9.5 03/14/2020 0949   GFRNONAA 76 03/14/2020 0949   GFRAA 88  03/14/2020 0949    Lipid Panel     Component Value Date/Time   CHOL 158 03/14/2020 0949   TRIG 42 03/14/2020 0949   HDL 74 03/14/2020 0949   CHOLHDL 2.1 03/14/2020 0949   CHOLHDL 3 01/07/2014 0917   VLDL 12.8 01/07/2014 0917   LDLCALC 75 03/14/2020 0949    CBC    Component Value Date/Time   WBC 3.9 03/14/2020 0949   WBC 5.0 01/07/2014 0917   RBC 4.96 03/14/2020 0949   RBC 4.28 01/07/2014 0917   HGB 12.3 03/14/2020 0949   HCT 38.1 03/14/2020 0949   PLT 221 03/14/2020 0949   MCV 77 (L) 03/14/2020 0949   MCH 24.8 (L) 03/14/2020 0949   MCHC 32.3 03/14/2020 0949   MCHC 32.3 01/07/2014 0917   RDW 19.8 (H) 03/14/2020 0949   LYMPHSABS 1.1 03/14/2020 0949   MONOABS 0.4 01/07/2014 0917   EOSABS 0.1 03/14/2020 0949   BASOSABS 0.0 03/14/2020 0949    Hgb A1C Lab Results  Component Value Date   HGBA1C 5.5 03/14/2020           Assessment & Plan:  Abnormal Weight Gain:  Discussed referral to nutritionist Discussed Contrave, side effects, benefits Stick to a 1400 calorie restricted diet daily Diet should consist mainly of protein and veggies Avoid processed, fried foods, carbs and sugary vegetables Do not drink your calories Eat every 3 hours to keep metabolism burning  Update me in 1 month with your weight  Webb Silversmith, NP This visit occurred during the SARS-CoV-2 public health emergency.  Safety protocols were in place, including screening questions prior to the visit, additional usage of staff PPE, and extensive cleaning of exam room while observing appropriate contact time as indicated for disinfecting solutions.

## 2020-06-03 NOTE — Progress Notes (Signed)
Per orders of Baity, NP, injection of B12, given by Aneta Mins, RN. Patient tolerated injection well in L Ventrogluteal.

## 2020-09-11 ENCOUNTER — Ambulatory Visit: Payer: Self-pay | Admitting: Physician Assistant

## 2020-09-25 ENCOUNTER — Ambulatory Visit: Payer: PRIVATE HEALTH INSURANCE | Admitting: Internal Medicine

## 2020-09-25 DIAGNOSIS — Z0289 Encounter for other administrative examinations: Secondary | ICD-10-CM

## 2020-09-25 NOTE — Progress Notes (Deleted)
Subjective:    Patient ID: Cassie Mills, female    DOB: [birth year], 45 y.o.   MRN: [id number]  HPI  Pt presents to the clinic today with c/o a mole in her pelvic area. She noticed this.  Review of Systems  Past Medical History:  Diagnosis Date  . Anemia   . Childhood asthma   . Heart murmur   . History of chicken pox 1984  . Hypertension   . Menorrhagia    due to fibroids  . Uterine fibroid     Current Outpatient Medications  Medication Sig Dispense Refill  . amLODipine (NORVASC) 5 MG tablet Take 1 tablet (5 mg total) by mouth 2 (two) times daily. 180 tablet 0  . ferrous sulfate 325 (65 FE) MG tablet Take 325 mg by mouth daily with breakfast.    . hydrochlorothiazide (HYDRODIURIL) 25 MG tablet Take 1 tablet (25 mg total) by mouth every morning. 90 tablet 0  . lisinopril (ZESTRIL) 10 MG tablet Take 1.5 tablets (15 mg total) by mouth daily. 180 tablet 0   No current facility-administered medications for this visit.    No Known Allergies  Family History  Problem Relation Age of Onset  . Arthritis Mother   . Hypertension Mother   . Kidney disease Mother   . Diabetes Mother   . Diabetes Maternal Aunt   . Kidney disease Maternal Aunt   . Diabetes Maternal Uncle   . Kidney disease Maternal Uncle   . Heart failure Paternal Aunt   . CAD Paternal Uncle   . Cancer Father        throat (smoker)  . Cancer Paternal Grandmother        throat (smoker)  . Stroke Neg Hx     Social History   Socioeconomic History  . Marital status: Single    Spouse name: Not on file  . Number of children: Not on file  . Years of education: Not on file  . Highest education level: Not on file  Occupational History  . Not on file  Tobacco Use  . Smoking status: Never Smoker  . Smokeless tobacco: Never Used  Substance and Sexual Activity  . Alcohol use: No  . Drug use: No  . Sexual activity: Yes  Other Topics Concern  . 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  HEENT: Denies eye pain, eye redness, ear pain, ringing in the ears, wax buildup, runny nose, nasal congestion, bloody nose, or sore throat. Respiratory: Denies difficulty breathing, shortness of breath, cough or sputum production.   Cardiovascular: Denies chest pain, chest tightness, palpitations or swelling in the hands or feet.  Gastrointestinal: Denies abdominal pain, bloating, constipation, diarrhea or blood in the stool.  GU: Denies urgency, frequency, pain with urination, burning sensation, blood in urine, odor or discharge. Musculoskeletal: Denies decrease in range of motion, difficulty with gait, muscle pain or joint pain and swelling.  Skin: Pt reports mole  in pelvic area. Denies redness, rashes, or ulcercations.  Neurological: Denies dizziness, difficulty with memory, difficulty with speech or problems with balance and coordination.  Psych: Denies anxiety, depression, SI/HI.  No other specific complaints in a complete review of systems (except as listed in HPI above).     Objective:   Physical Exam There were no vitals taken for this visit. Wt Readings from Last 3 Encounters:  06/03/20 221 lb (100.2 kg)  11/01/19 238 lb (108 kg)  10/09/19 242 lb (109.8 kg)    General: Appears their stated age, well developed, well nourished in NAD. Skin: Warm, dry and intact. No rashes, lesions or ulcerations noted. HEENT: Head: normal shape and size; Eyes: sclera white, no icterus, conjunctiva pink,  PERRLA and EOMs intact; Ears: Tm's gray and intact, normal light reflex; Nose: mucosa pink and moist, septum midline; Throat/Mouth: Teeth present, mucosa pink and moist, no exudate, lesions or ulcerations noted.  Neck:  Neck supple, trachea midline. No masses, lumps or thyromegaly present.  Cardiovascular: Normal rate and rhythm. S1,S2 noted.  No murmur, rubs or gallops noted. No JVD or BLE edema. No carotid bruits noted. Pulmonary/Chest: Normal effort and positive vesicular breath sounds. No respiratory distress. No wheezes, rales or ronchi noted.  Abdomen: Soft and nontender. Normal bowel sounds. No distention or masses noted. Liver, spleen and kidneys non palpable. Musculoskeletal: Normal range of motion. No signs of joint swelling. No difficulty with gait.  Neurological: Alert and oriented. Cranial nerves II-XII grossly intact. Coordination normal.  Psychiatric: Mood and affect normal. Behavior is normal. Judgment and thought content normal.   EKG:  BMET    Component Value Date/Time   NA 139 03/14/2020 0949   K 3.7 03/14/2020 0949   CL 98 03/14/2020 0949   CO2 24 01/07/2014 0917   GLUCOSE 85 03/14/2020 0949   GLUCOSE 72 01/07/2014 0917   BUN 11 03/14/2020 0949   CREATININE 0.91 03/14/2020 0949   CALCIUM 9.5 03/14/2020 0949   GFRNONAA 76 03/14/2020 0949   GFRAA 88 03/14/2020 0949    Lipid Panel     Component Value Date/Time   CHOL 158 03/14/2020 0949   TRIG 42 03/14/2020 0949   HDL 74 03/14/2020 0949   CHOLHDL 2.1 03/14/2020 0949   CHOLHDL 3 01/07/2014 0917   VLDL 12.8 01/07/2014 0917   LDLCALC 75 03/14/2020 0949    CBC    Component Value Date/Time   WBC 3.9 03/14/2020 0949   WBC 5.0 01/07/2014 0917   RBC 4.96 03/14/2020 0949   RBC 4.28 01/07/2014 0917   HGB 12.3 03/14/2020 0949   HCT 38.1 03/14/2020 0949   PLT 221 03/14/2020 0949   MCV 77 (L) 03/14/2020 0949   MCH 24.8 (L) 03/14/2020 0949   MCHC 32.3 03/14/2020 0949   MCHC 32.3 01/07/2014 0917   RDW 19.8 (H)  03/14/2020 0949   LYMPHSABS 1.1 03/14/2020 0949   MONOABS 0.4 01/07/2014 0917   EOSABS 0.1 03/14/2020 0949   BASOSABS 0.0 03/14/2020 0949    Hgb A1C Lab Results  Component Value Date   HGBA1C 5.5 03/14/2020             Assessment & Plan:     Webb Silversmith, NP This visit occurred during the SARS-CoV-2 public health emergency.  Safety protocols were in place, including screening questions prior to the visit, additional usage of staff PPE, and extensive cleaning of exam room while observing appropriate contact time as indicated for disinfecting solutions.

## 2020-10-13 ENCOUNTER — Telehealth: Payer: Self-pay | Admitting: Internal Medicine

## 2020-10-13 NOTE — Telephone Encounter (Signed)
Patient needs lab orders put in for  10/20/2020 for physical blood work.

## 2020-10-20 ENCOUNTER — Other Ambulatory Visit: Payer: Self-pay | Admitting: Internal Medicine

## 2020-10-20 ENCOUNTER — Other Ambulatory Visit: Payer: Self-pay

## 2020-10-27 ENCOUNTER — Encounter: Payer: Self-pay | Admitting: Internal Medicine

## 2020-11-10 ENCOUNTER — Ambulatory Visit: Payer: Self-pay | Admitting: Internal Medicine

## 2020-11-10 NOTE — Progress Notes (Deleted)
  Subjective:     Patient ID: Cassie Mills, female   DOB: May 18, 1976, 45 y.o.   MRN: [id number]  HPI   Review of Systems     Objective:   Physical Exam     Assessment:     ***    Plan:     ***

## 2020-11-10 NOTE — Progress Notes (Deleted)
Subjective:    Patient ID: Cassie Mills, female    DOB: 08-30-1975, 45 y.o.   MRN: [id number]  HPI  Patient presents to clinic today with complaint of fatigue.  She noticed this about 1 month ago.  She is concerned that her iron levels may be low.  Her last H&H was 12.3/38.1, 02/2020. She is taking an oral iron supplement.  Review of Systems      Past Medical History:  Diagnosis Date  . Anemia   . Childhood asthma   . Heart murmur   . History of chicken pox 1984  . Hypertension   . Menorrhagia    due to fibroids  . Uterine fibroid     Current Outpatient Medications  Medication Sig Dispense Refill  . amLODipine (NORVASC) 5 MG tablet Take 1 tablet (5 mg total) by mouth 2 (two) times daily. 180 tablet 0  . ferrous sulfate 325 (65 FE) MG tablet Take 325 mg by mouth daily with breakfast.    . hydrochlorothiazide (HYDRODIURIL) 25 MG tablet Take 1 tablet (25 mg total) by mouth every morning. 90 tablet 0  . lisinopril (ZESTRIL) 10 MG tablet Take 1.5 tablets (15 mg total) by mouth daily. 180 tablet 0   No current facility-administered medications for this visit.    No Known Allergies  Family History  Problem Relation Age of Onset  . Arthritis Mother   . Hypertension Mother   . Kidney disease Mother   . Diabetes Mother   . Diabetes Maternal Aunt   . Kidney disease Maternal Aunt   . Diabetes Maternal Uncle   . Kidney disease Maternal Uncle   . Heart failure Paternal Aunt   . CAD Paternal Uncle   . Cancer Father        throat (smoker)  . Cancer Paternal Grandmother        throat (smoker)  . Stroke Neg Hx     Social History   Socioeconomic History  . Marital status: Single    Spouse name: Not on file  . Number of children: Not on file  . Years of education: Not on file  . Highest education level: Not on file  Occupational History  . Not on file  Tobacco Use  . Smoking status: Never Smoker  . Smokeless tobacco: Never Used  Substance and Sexual Activity  .  Alcohol use: No  . Drug use: No  . Sexual activity: Yes  Other Topics Concern  . 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 Denies fever, malaise, headache or abrupt weight changes.  HEENT: Denies eye pain, eye redness, ear pain, ringing in the ears, wax buildup, runny nose, nasal congestion, bloody nose, or sore throat. Respiratory: Denies difficulty breathing, shortness of breath, cough or sputum production.   Cardiovascular: Denies chest pain, chest tightness, palpitations or swelling in the hands or feet.  Gastrointestinal: Denies abdominal pain, bloating, constipation, diarrhea or blood in the stool.  GU: Denies urgency, frequency, pain with  urination, burning sensation, blood in urine, odor or discharge. Musculoskeletal: Denies decrease in range of motion, difficulty with gait, muscle pain or joint pain and swelling.  Skin: Denies redness, rashes, lesions or ulcercations.  Neurological: Denies dizziness, difficulty with memory, difficulty with speech or problems with balance and coordination.  Psych: Denies anxiety, depression, SI/HI.  No other specific complaints in a complete review of systems (except as listed in HPI above).  Objective:   Physical Exam There were no vitals taken for this visit. Wt Readings from Last 3 Encounters:  06/03/20 221 lb (100.2 kg)  11/01/19 238 lb (108 kg)  10/09/19 242 lb (109.8 kg)    General: Appears their stated age, well developed, well nourished in NAD. Skin: Warm, dry and intact.  No rashes, lesions or ulcerations noted. HEENT: Head: normal shape and size; Eyes: sclera white, no icterus, conjunctiva pink, PERRLA and EOMs intact; Ears: Tm's gray and intact, normal light reflex; Nose: mucosa pink and moist, septum midline; Throat/Mouth: Teeth present, mucosa pink and moist, no exudate, lesions or ulcerations noted.  Neck:  Neck supple, trachea midline. No masses, lumps or thyromegaly present.  Cardiovascular: Normal rate and rhythm. S1,S2 noted.  No murmur, rubs or gallops noted. No JVD or BLE edema. No carotid bruits noted. Pulmonary/Chest: Normal effort and positive vesicular breath sounds. No respiratory distress. No wheezes, rales or ronchi noted.  Abdomen: Soft and nontender. Normal bowel sounds. No distention or masses noted. Liver, spleen and kidneys non palpable. Musculoskeletal: Normal range of motion. No signs of joint swelling. No difficulty with gait.  Neurological: Alert and oriented. Cranial nerves II-XII grossly intact. Coordination normal.  Psychiatric: Mood and affect normal. Behavior is normal. Judgment and thought content normal.   EKG:  BMET    Component Value Date/Time   NA 139 03/14/2020 0949   K 3.7 03/14/2020 0949   CL 98 03/14/2020 0949   CO2 24 01/07/2014 0917   GLUCOSE 85 03/14/2020 0949   GLUCOSE 72 01/07/2014 0917   BUN 11 03/14/2020 0949   CREATININE 0.91 03/14/2020 0949   CALCIUM 9.5 03/14/2020 0949   GFRNONAA 76 03/14/2020 0949   GFRAA 88 03/14/2020 0949    Lipid Panel     Component Value Date/Time   CHOL 158 03/14/2020 0949   TRIG 42 03/14/2020 0949   HDL 74 03/14/2020 0949   CHOLHDL 2.1 03/14/2020 0949   CHOLHDL 3 01/07/2014 0917   VLDL 12.8 01/07/2014 0917   LDLCALC 75 03/14/2020 0949    CBC    Component Value Date/Time   WBC 3.9 03/14/2020 0949   WBC 5.0 01/07/2014 0917   RBC 4.96 03/14/2020 0949   RBC 4.28 01/07/2014 0917   HGB 12.3 03/14/2020 0949   HCT 38.1 03/14/2020 0949   PLT 221 03/14/2020 0949   MCV 77  (L) 03/14/2020 0949   MCH 24.8 (L) 03/14/2020 0949   MCHC 32.3 03/14/2020 0949   MCHC 32.3 01/07/2014 0917   RDW 19.8 (H) 03/14/2020 0949   LYMPHSABS 1.1 03/14/2020 0949   MONOABS 0.4 01/07/2014 0917   EOSABS 0.1 03/14/2020 0949   BASOSABS 0.0 03/14/2020 0949    Hgb A1C Lab Results  Component Value Date   HGBA1C 5.5 03/14/2020            Assessment & Plan:     Webb Silversmith, NP This visit occurred during the SARS-CoV-2 public health emergency.  Safety protocols were in place, including screening questions prior to the visit, additional usage of staff PPE,  and extensive cleaning of exam room while observing appropriate contact time as indicated for disinfecting solutions.

## 2020-11-18 ENCOUNTER — Ambulatory Visit: Payer: Self-pay | Admitting: Registered Nurse

## 2020-11-18 ENCOUNTER — Other Ambulatory Visit: Payer: Self-pay

## 2020-11-18 VITALS — BP 182/118 | HR 71 | Temp 98.4°F

## 2020-11-18 DIAGNOSIS — I10 Essential (primary) hypertension: Secondary | ICD-10-CM

## 2020-11-18 DIAGNOSIS — M6530 Trigger finger, unspecified finger: Secondary | ICD-10-CM

## 2020-11-18 MED ORDER — ACETAMINOPHEN 500 MG PO TABS: 1000.0000 mg | ORAL_TABLET | Freq: Four times a day (QID) | ORAL | 0 refills | Status: AC | PRN

## 2020-11-18 MED ORDER — HYDROCHLOROTHIAZIDE 25 MG PO TABS: 25.0000 mg | ORAL_TABLET | Freq: Every morning | ORAL | 0 refills | Status: DC

## 2020-11-18 MED ORDER — LISINOPRIL 10 MG PO TABS: 15.0000 mg | ORAL_TABLET | Freq: Every day | ORAL | 0 refills | Status: DC

## 2020-11-18 NOTE — Patient Instructions (Signed)
Trigger Finger  Trigger finger, also called stenosing tenosynovitis,  is a condition that causes a finger to get stuck in a bent position. Each finger has a tendon, which is a tough, cord-like tissue that connects muscle to bone, and each tendon passes through a tunnel of tissue called a tendon sheath. To move your finger, your tendon needs to glide freely through the sheath. Trigger finger happens when the tendon or the sheath thickens, making it difficult to move your finger. Trigger finger can affect any finger or a thumb. It may affect more than one finger. Mild cases may clear up with rest and medicine. Severe cases require more treatment. What are the causes? Trigger finger is caused by a thickened finger tendon or tendon sheath. The cause of this thickening is not known. What increases the risk? The following factors may make you more likely to develop this condition:  Doing activities that require a strong grip.  Having rheumatoid arthritis, gout, or diabetes.  Being 58-108 years old.  Being female. What are the signs or symptoms? Symptoms of this condition include:  Pain when bending or straightening your finger.  Tenderness or swelling where your finger attaches to the palm of your hand.  A lump in the palm of your hand or on the inside of your finger.  Hearing a noise like a pop or a snap when you try to straighten your finger.  Feeling a catching or locking sensation when you try to straighten your finger.  Being unable to straighten your finger. How is this diagnosed? This condition is diagnosed based on your symptoms and a physical exam. How is this treated? This condition may be treated by:  Resting your finger and avoiding activities that make symptoms worse.  Wearing a finger splint to keep your finger extended.  Taking NSAIDs, such as ibuprofen, to relieve pain and swelling.  Doing gentle exercises to stretch the finger as told by your health care  provider.  Having medicine that reduces swelling and inflammation (steroids) injected into the tendon sheath. Injections may need to be repeated.  Having surgery to open the tendon sheath. This may be done if other treatments do not work and you cannot straighten your finger. You may need physical therapy after surgery. Follow these instructions at home: If you have a splint:  Wear the splint as told by your health care provider. Remove it only as told by your health care provider.  Loosen it if your fingers tingle, become numb, or turn cold and blue.  Keep it clean.  If the splint is not waterproof: ? Do not let it get wet. ? Cover it with a watertight covering when you take a bath or shower. Managing pain, stiffness, and swelling If directed, apply heat to the affected area as often as told by your health care provider. Use the heat source that your health care provider recommends, such as a moist heat pack or a heating pad.  Place a towel between your skin and the heat source.  Leave the heat on for 20-30 minutes.  Remove the heat if your skin turns bright red. This is especially important if you are unable to feel pain, heat, or cold. You may have a greater risk of getting burned. If directed, put ice on the painful area. To do this:  If you have a removable splint, remove it as told by your health care provider.  Put ice in a plastic bag.  Place a towel between your skin  and the bag or between your splint and the bag.  Leave the ice on for 20 minutes, 2-3 times a day.      Activity  Rest your finger as told by your health care provider. Avoid activities that make the pain worse.  Return to your normal activities as told by your health care provider. Ask your health care provider what activities are safe for you.  Do exercises as told by your health care provider.  Ask your health care provider when it is safe to drive if you have a splint on your hand. General  instructions  Take over-the-counter and prescription medicines only as told by your health care provider.  Keep all follow-up visits as told by your health care provider. This is important. Contact a health care provider if:  Your symptoms are not improving with home care. Summary  Trigger finger, also called stenosing tenosynovitis, causes your finger to get stuck in a bent position. This can make it difficult and painful to straighten your finger.  This condition develops when a finger tendon or tendon sheath thickens.  Treatment may include resting your finger, wearing a splint, and taking medicines.  In severe cases, surgery to open the tendon sheath may be needed. This information is not intended to replace advice given to you by your health care provider. Make sure you discuss any questions you have with your health care provider. Document Revised: 10/23/2018 Document Reviewed: 10/23/2018 Elsevier Patient Education  [place]. [email or web address]">  DASH Eating Plan DASH stands for Dietary Approaches to Stop Hypertension. The DASH eating plan is a healthy eating plan that has been shown to:  Reduce high blood pressure (hypertension).  Reduce your risk for type 2 diabetes, heart disease, and stroke.  Help with weight loss. What are tips for following this plan? Reading food labels  Check food labels for the amount of salt (sodium) per serving. Choose foods with less than 5 percent of the Daily Value of sodium. Generally, foods with less than 300 milligrams (mg) of sodium per serving fit into this eating plan.  To find whole grains, look for the word "whole" as the first word in the ingredient list. Shopping  Buy products labeled as "low-sodium" or "no salt added."  Buy fresh foods. Avoid canned foods and pre-made or frozen meals. Cooking  Avoid adding salt when cooking. Use salt-free seasonings or herbs instead of  table salt or sea salt. Check with your health care provider or pharmacist before using salt substitutes.  Do not fry foods. Cook foods using healthy methods such as baking, boiling, grilling, roasting, and broiling instead.  Cook with heart-healthy oils, such as olive, canola, avocado, soybean, or sunflower oil. Meal planning  Eat a balanced diet that includes: ? 4 or more servings of fruits and 4 or more servings of vegetables each day. Try to fill one-half of your plate with fruits and vegetables. ? 6-8 servings of whole grains each day. ? Less than 6 oz (170 g) of lean meat, poultry, or fish each day. A 3-oz (85-g) serving of meat is about the same size as a deck of cards. One egg equals 1 oz (28 g). ? 2-3 servings of low-fat dairy each day. One serving is 1 cup (237 mL). ? 1 serving of nuts, seeds, or beans 5 times each week. ? 2-3 servings of heart-healthy fats. Healthy fats called omega-3 fatty acids are found in foods such as walnuts, flaxseeds, fortified milks, and eggs. These fats are  also found in cold-water fish, such as sardines, salmon, and mackerel.  Limit how much you eat of: ? Canned or prepackaged foods. ? Food that is high in trans fat, such as some fried foods. ? Food that is high in saturated fat, such as fatty meat. ? Desserts and other sweets, sugary drinks, and other foods with added sugar. ? Full-fat dairy products.  Do not salt foods before eating.  Do not eat more than 4 egg yolks a week.  Try to eat at least 2 vegetarian meals a week.  Eat more home-cooked food and less restaurant, buffet, and fast food.   Lifestyle  When eating at a restaurant, ask that your food be prepared with less salt or no salt, if possible.  If you drink alcohol: ? Limit how much you use to:  0-1 drink a day for women who are not pregnant.  0-2 drinks a day for men. ? Be aware of how much alcohol is in your drink. In the [place] one drink equals one 12 oz bottle of beer (355  mL), one 5 oz glass of wine (148 mL), or one 1 oz glass of hard liquor (44 mL). General information  Avoid eating more than 2,300 mg of salt a day. If you have hypertension, you may need to reduce your sodium intake to 1,500 mg a day.  Work with your health care provider to maintain a healthy body weight or to lose weight. Ask what an ideal weight is for you.  Get at least 30 minutes of exercise that causes your heart to beat faster (aerobic exercise) most days of the week. Activities may include walking, swimming, or biking.  Work with your health care provider or dietitian to adjust your eating plan to your individual calorie needs. What foods should I eat? Fruits All fresh, dried, or frozen fruit. Canned fruit in natural juice (without added sugar). Vegetables Fresh or frozen vegetables (raw, steamed, roasted, or grilled). Low-sodium or reduced-sodium tomato and vegetable juice. Low-sodium or reduced-sodium tomato sauce and tomato paste. Low-sodium or reduced-sodium canned vegetables. Grains Whole-grain or whole-wheat bread. Whole-grain or whole-wheat pasta. Brown rice. [place]. Bulgur. Whole-grain and low-sodium cereals. Pita bread. Low-fat, low-sodium crackers. Whole-wheat flour tortillas. Meats and other proteins Skinless chicken or [place]. Ground chicken or [place]. Pork with fat trimmed off. Fish and seafood. Egg whites. Dried beans, peas, or lentils. Unsalted nuts, nut butters, and seeds. Unsalted canned beans. Lean cuts of beef with fat trimmed off. Low-sodium, lean precooked or cured meat, such as sausages or meat loaves. Dairy Low-fat (1%) or fat-free (skim) milk. Reduced-fat, low-fat, or fat-free cheeses. Nonfat, low-sodium ricotta or cottage cheese. Low-fat or nonfat yogurt. Low-fat, low-sodium cheese. Fats and oils Soft margarine without trans fats. Vegetable oil. Reduced-fat, low-fat, or light mayonnaise and salad dressings (reduced-sodium). Canola, safflower, olive,  avocado, soybean, and sunflower oils. Avocado. Seasonings and condiments Herbs. Spices. Seasoning mixes without salt. Other foods Unsalted popcorn and pretzels. Fat-free sweets. The items listed above may not be a complete list of foods and beverages you can eat. Contact a dietitian for more information. What foods should I avoid? Fruits Canned fruit in a light or heavy syrup. Fried fruit. Fruit in cream or butter sauce. Vegetables Creamed or fried vegetables. Vegetables in a cheese sauce. Regular canned vegetables (not low-sodium or reduced-sodium). Regular canned tomato sauce and paste (not low-sodium or reduced-sodium). Regular tomato and vegetable juice (not low-sodium or reduced-sodium). Angie Fava. Olives. Grains Baked goods made with fat, such as  croissants, muffins, or some breads. Dry pasta or rice meal packs. Meats and other proteins Fatty cuts of meat. Ribs. Fried meat. Berniece Salines. Bologna, salami, and other precooked or cured meats, such as sausages or meat loaves. Fat from the back of a pig (fatback). Bratwurst. Salted nuts and seeds. Canned beans with added salt. Canned or smoked fish. Whole eggs or egg yolks. Chicken or [place] with skin. Dairy Whole or 2% milk, cream, and half-and-half. Whole or full-fat cream cheese. Whole-fat or sweetened yogurt. Full-fat cheese. Nondairy creamers. Whipped toppings. Processed cheese and cheese spreads. Fats and oils Butter. Stick margarine. Lard. Shortening. Ghee. Bacon fat. Tropical oils, such as coconut, palm kernel, or palm oil. Seasonings and condiments Onion salt, garlic salt, seasoned salt, table salt, and sea salt. Worcestershire sauce. Tartar sauce. Barbecue sauce. Teriyaki sauce. Soy sauce, including reduced-sodium. Steak sauce. Canned and packaged gravies. Fish sauce. Oyster sauce. Cocktail sauce. Store-bought horseradish. Ketchup. Mustard. Meat flavorings and tenderizers. Bouillon cubes. Hot sauces. Pre-made or packaged marinades. Pre-made or  packaged taco seasonings. Relishes. Regular salad dressings. Other foods Salted popcorn and pretzels. The items listed above may not be a complete list of foods and beverages you should avoid. Contact a dietitian for more information. Where to find more information  National Heart, Lung, and Blood Institute: https://wilson-eaton.com/  American Heart Association: www.heart.org  Academy of Nutrition and Dietetics: www.eatright.[place]: www.kidney.org Summary  The DASH eating plan is a healthy eating plan that has been shown to reduce high blood pressure (hypertension). It may also reduce your risk for type 2 diabetes, heart disease, and stroke.  When on the DASH eating plan, aim to eat more fresh fruits and vegetables, whole grains, lean proteins, low-fat dairy, and heart-healthy fats.  With the DASH eating plan, you should limit salt (sodium) intake to 2,300 mg a day. If you have hypertension, you may need to reduce your sodium intake to 1,500 mg a day.  Work with your health care provider or dietitian to adjust your eating plan to your individual calorie needs. This information is not intended to replace advice given to you by your health care provider. Make sure you discuss any questions you have with your health care provider. Document Revised: 05/11/2019 Document Reviewed: 05/11/2019 Elsevier Patient Education  2021 [place]. Managing Your Hypertension Hypertension, also called high blood pressure, is when the force of the blood pressing against the walls of the arteries is too strong. Arteries are blood vessels that carry blood from your heart throughout your body. Hypertension forces the heart to work harder to pump blood and may cause the arteries to become narrow or stiff. Understanding blood pressure readings Your personal target blood pressure may vary depending on your medical conditions, your age, and other factors. A blood pressure reading includes a  higher number over a lower number. Ideally, your blood pressure should be below 120/80. You should know that:  The first, or top, number is called the systolic pressure. It is a measure of the pressure in your arteries as your heart beats.  The second, or bottom number, is called the diastolic pressure. It is a measure of the pressure in your arteries as the heart relaxes. Blood pressure is classified into four stages. Based on your blood pressure reading, your health care provider may use the following stages to determine what type of treatment you need, if any. Systolic pressure and diastolic pressure are measured in a unit called mmHg. Normal  Systolic pressure: below 941.  Diastolic pressure: below 80. Elevated  Systolic pressure: 119-147.  Diastolic pressure: below 80. Hypertension stage 1  Systolic pressure: 829-562.  Diastolic pressure: 13-08. Hypertension stage 2  Systolic pressure: 657 or above.  Diastolic pressure: 90 or above. How can this condition affect me? Managing your hypertension is an important responsibility. Over time, hypertension can damage the arteries and decrease blood flow to important parts of the body, including the brain, heart, and kidneys. Having untreated or uncontrolled hypertension can lead to:  A heart attack.  A stroke.  A weakened blood vessel (aneurysm).  Heart failure.  Kidney damage.  Eye damage.  Metabolic syndrome.  Memory and concentration problems.  Vascular dementia. What actions can I take to manage this condition? Hypertension can be managed by making lifestyle changes and possibly by taking medicines. Your health care provider will help you make a plan to bring your blood pressure within a normal range. Nutrition  Eat a diet that is high in fiber and potassium, and low in salt (sodium), added sugar, and fat. An example eating plan is called the Dietary Approaches to Stop Hypertension (DASH) diet. To eat this  way: ? Eat plenty of fresh fruits and vegetables. Try to fill one-half of your plate at each meal with fruits and vegetables. ? Eat whole grains, such as whole-wheat pasta, brown rice, or whole-grain bread. Fill about one-fourth of your plate with whole grains. ? Eat low-fat dairy products. ? Avoid fatty cuts of meat, processed or cured meats, and poultry with skin. Fill about one-fourth of your plate with lean proteins such as fish, chicken without skin, beans, eggs, and tofu. ? Avoid pre-made and processed foods. These tend to be higher in sodium, added sugar, and fat.  Reduce your daily sodium intake. Most people with hypertension should eat less than 1,500 mg of sodium a day.   Lifestyle  Work with your health care provider to maintain a healthy body weight or to lose weight. Ask what an ideal weight is for you.  Get at least 30 minutes of exercise that causes your heart to beat faster (aerobic exercise) most days of the week. Activities may include walking, swimming, or biking.  Include exercise to strengthen your muscles (resistance exercise), such as weight lifting, as part of your weekly exercise routine. Try to do these types of exercises for 30 minutes at least 3 days a week.  Do not use any products that contain nicotine or tobacco, such as cigarettes, e-cigarettes, and chewing tobacco. If you need help quitting, ask your health care provider.  Control any long-term (chronic) conditions you have, such as high cholesterol or diabetes.  Identify your sources of stress and find ways to manage stress. This may include meditation, deep breathing, or making time for fun activities.   Alcohol use  Do not drink alcohol if: ? Your health care provider tells you not to drink. ? You are pregnant, may be pregnant, or are planning to become pregnant.  If you drink alcohol: ? Limit how much you use to:  0-1 drink a day for women.  0-2 drinks a day for men. ? Be aware of how much alcohol  is in your drink. In the [place] one drink equals one 12 oz bottle of beer (355 mL), one 5 oz glass of wine (148 mL), or one 1 oz glass of hard liquor (44 mL). Medicines Your health care provider may prescribe medicine if lifestyle changes are not enough to get your blood pressure under control  and if:  Your systolic blood pressure is 130 or higher.  Your diastolic blood pressure is 80 or higher. Take medicines only as told by your health care provider. Follow the directions carefully. Blood pressure medicines must be taken as told by your health care provider. The medicine does not work as well when you skip doses. Skipping doses also puts you at risk for problems. Monitoring Before you monitor your blood pressure:  Do not smoke, drink caffeinated beverages, or exercise within 30 minutes before taking a measurement.  Use the bathroom and empty your bladder (urinate).  Sit quietly for at least 5 minutes before taking measurements. Monitor your blood pressure at home as told by your health care provider. To do this:  Sit with your back straight and supported.  Place your feet flat on the floor. Do not cross your legs.  Support your arm on a flat surface, such as a table. Make sure your upper arm is at heart level.  Each time you measure, take two or three readings one minute apart and record the results. You may also need to have your blood pressure checked regularly by your health care provider.   General information  Talk with your health care provider about your diet, exercise habits, and other lifestyle factors that may be contributing to hypertension.  Review all the medicines you take with your health care provider because there may be side effects or interactions.  Keep all visits as told by your health care provider. Your health care provider can help you create and adjust your plan for managing your high blood pressure. Where to find more information  National Heart, Lung,  and Blood Institute: https://wilson-eaton.com/  American Heart Association: www.heart.org Contact a health care provider if:  You think you are having a reaction to medicines you have taken.  You have repeated (recurrent) headaches.  You feel dizzy.  You have swelling in your ankles.  You have trouble with your vision. Get help right away if:  You develop a severe headache or confusion.  You have unusual weakness or numbness, or you feel faint.  You have severe pain in your chest or abdomen.  You vomit repeatedly.  You have trouble breathing. These symptoms may represent a serious problem that is an emergency. Do not wait to see if the symptoms will go away. Get medical help right away. Call your local emergency services (911 in the [place] Do not drive yourself to the hospital. Summary  Hypertension is when the force of blood pumping through your arteries is too strong. If this condition is not controlled, it may put you at risk for serious complications.  Your personal target blood pressure may vary depending on your medical conditions, your age, and other factors. For most people, a normal blood pressure is less than 120/80.  Hypertension is managed by lifestyle changes, medicines, or both.  Lifestyle changes to help manage hypertension include losing weight, eating a healthy, low-sodium diet, exercising more, stopping smoking, and limiting alcohol. This information is not intended to replace advice given to you by your health care provider. Make sure you discuss any questions you have with your health care provider. Document Revised: 07/13/2019 Document Reviewed: 05/08/2019 Elsevier Patient Education  2021 [employer].

## 2020-11-18 NOTE — Progress Notes (Signed)
Subjective:    Patient ID: Cassie Mills, female    DOB: 1975-08-26, 45 y.o.   MRN: [id number]  44y/o African American established female pt c/o L 4th finger pain r/t trigger finger. Treated in 2020 with Kenalog injection per record review. Had used finger splint earlier in 2020 provided by onsite clinic. She is requesting another splint as pain has returned in same finger. Though she also sts she knows it will need another injection, she wants to put that off due to the pain of the injection.  Patient has been taking advil prn for finger pain.  Today better than usual not hurting as much today or catching.  Forgot to take her blood pressure medication this morning and running low on some but not all needs refill.  Patient reported she drank bottle of mountain dew this am also and that probably raised her blood pressure also.  Right hand dominant     Review of Systems  Constitutional: Negative for activity change, appetite change, chills, diaphoresis, fatigue, fever and unexpected weight change.  HENT: Negative for trouble swallowing and voice change.   Eyes: Negative for photophobia and visual disturbance.  Respiratory: Negative for cough, shortness of breath, wheezing and stridor.   Cardiovascular: Negative for chest pain, palpitations and leg swelling.  Gastrointestinal: Negative for diarrhea, nausea and vomiting.  Endocrine: Negative for cold intolerance and heat intolerance.  Genitourinary: Negative for difficulty urinating.  Musculoskeletal: Positive for myalgias. Negative for back pain, gait problem, neck pain and neck stiffness.  Skin: Negative for rash.  Allergic/Immunologic: Negative for food allergies.  Neurological: Negative for dizziness, tremors, seizures, syncope, facial asymmetry, speech difficulty, weakness, light-headedness, numbness and headaches.  Hematological: Negative for adenopathy. Does not bruise/bleed easily.  Psychiatric/Behavioral: Negative for agitation,  confusion and sleep disturbance.       Objective:   Physical Exam Constitutional:      General: She is awake. She is not in acute distress.    Appearance: Normal appearance. She is well-developed and well-groomed. She is obese. She is not ill-appearing, toxic-appearing or diaphoretic.  HENT:     Head: Normocephalic and atraumatic.     Jaw: There is normal jaw occlusion.     Salivary Glands: Right salivary gland is not diffusely enlarged or tender. Left salivary gland is not diffusely enlarged or tender.     Right Ear: Hearing and external ear normal.     Left Ear: Hearing and external ear normal.     Nose: Nose normal. No congestion or rhinorrhea.     Mouth/Throat:     Lips: Pink. No lesions.     Mouth: Mucous membranes are moist. No lacerations, oral lesions or angioedema.     Dentition: No gum lesions.     Tongue: No lesions. Tongue does not deviate from midline.     Palate: No mass and lesions.     Pharynx: Oropharynx is clear. Uvula midline. No uvula swelling.  Eyes:     General: Lids are normal. Vision grossly intact. Gaze aligned appropriately. No visual field deficit or scleral icterus.       Right eye: No discharge.        Left eye: No discharge.     Extraocular Movements: Extraocular movements intact.     Conjunctiva/sclera: Conjunctivae normal.     Pupils: Pupils are equal, round, and reactive to light.  Neck:     Trachea: Trachea and phonation normal. No tracheal deviation.  Cardiovascular:     Rate and Rhythm:  Normal rate and regular rhythm.     Pulses: Normal pulses.          Radial pulses are 2+ on the right side and 2+ on the left side.  Pulmonary:     Effort: Pulmonary effort is normal. No respiratory distress.     Breath sounds: Normal breath sounds and air entry. No stridor or transmitted upper airway sounds. No wheezing or rhonchi.     Comments: Patient spoke full sentences without difficulty no cough in exam room Abdominal:     Palpations: Abdomen is soft.   Musculoskeletal:        General: Tenderness present. No swelling or deformity. Normal range of motion.     Right shoulder: No swelling, deformity, effusion or laceration. Normal range of motion.     Left shoulder: No swelling, deformity, effusion or laceration. Normal range of motion.     Right elbow: No swelling, deformity, effusion or lacerations. Normal range of motion.     Left elbow: No swelling, deformity, effusion or lacerations. Normal range of motion.     Right forearm: No swelling, edema, deformity or lacerations.     Left forearm: No swelling, edema, deformity or lacerations.     Right wrist: No swelling, deformity, effusion or lacerations. Normal range of motion.     Left wrist: No swelling, deformity, effusion or lacerations. Normal range of motion.     Right hand: Tenderness present. No swelling, deformity, lacerations or bony tenderness. Normal range of motion. Normal strength.     Left hand: No swelling, deformity, lacerations, tenderness or bony tenderness. Normal range of motion. Normal strength.       Arms:     Cervical back: Normal range of motion and neck supple. No edema, erythema, signs of trauma, rigidity, torticollis or crepitus. No pain with movement. Normal range of motion.     Right lower leg: No edema.     Left lower leg: No edema.     Comments: Abduction/adduction/flexion/extension equal bilaterally fingers; strength 5/5  Lymphadenopathy:     Head:     Right side of head: No preauricular adenopathy.     Left side of head: No preauricular adenopathy.     Cervical: No cervical adenopathy.     Right cervical: No superficial cervical adenopathy.    Left cervical: No superficial cervical adenopathy.  Skin:    General: Skin is warm and dry.     Capillary Refill: Capillary refill takes less than 2 seconds.     Coloration: Skin is not ashen, cyanotic, jaundiced, mottled, pale or sallow.     Findings: No abrasion, abscess, acne, bruising, burn, ecchymosis,  erythema, signs of injury, laceration, lesion, petechiae, rash or wound.     Nails: There is no clubbing.  Neurological:     General: No focal deficit present.     Mental Status: She is alert and oriented to person, place, and time. Mental status is at baseline.     GCS: GCS eye subscore is 4. GCS verbal subscore is 5. GCS motor subscore is 6.     Cranial Nerves: Cranial nerves are intact. No cranial nerve deficit, dysarthria or facial asymmetry.     Sensory: Sensation is intact. No sensory deficit.     Motor: Motor function is intact. No weakness, tremor, atrophy, abnormal muscle tone or seizure activity.     Coordination: Coordination is intact. Coordination normal.     Gait: Gait is intact. Gait normal.  Psychiatric:  Attention and Perception: Attention and perception normal.        Mood and Affect: Mood and affect normal.        Speech: Speech normal.        Behavior: Behavior normal. Behavior is cooperative.        Thought Content: Thought content normal.        Cognition and Memory: Cognition and memory normal.        Judgment: Judgment normal.     1130 went to patient workcenter to verify medications taken but patient not at her work area/on break.        Assessment & Plan:  A-trigger finger right 4th digit acquired initial visit, essential hypertension, obesity  P-Fitted and distributed metal finger splint padded with velcro to patient from clinic stock.  Patient doesn't want to go to orthopedics at this time for injection hurt too much last time.  Pain and symptom relief lasted 2 years with previous injection.  Switch from aleve to tylenol 1000mg  po QID prn pain as aleve counteracts her blood pressure medication.Restart epsom salt soak/Contrast bath soaks. Encouraged patient to wear finger splintcontinuouslywhile at workand home againexcept to shower.  Consider orthopedics consult if no improvement with conservative care.  cryotherapy 15 minutes TID prn and biofreeze  gel topical QID prn pain at home. Exitcare handout trigger finger printed and given to patient. Patient verbalized understanding information/instructions, agreed with plan of care and had no further questions at this time.   Take her medication immediately when she gets back to workcenter from her purse.  See RN Hildred Alamin for repeat BP check.  Check her bottles again to verify which meds need refill/running low and notify clinic staff and will fill today or Thursday for her.  She thinks low on hydrochlorothiazide.  Chronically forgetting to take her blood pressure medications.  Recommend keeping one bottle at work and one at home or put pill manager in her purse with doses to take on days she forgets.  exitcare handout managing hypertension and dash diet printed and given to patient.See RN Hildred Alamin next week for BP recheck, ensure she has taken her blood pressure medications daily prior to seeing RN Hildred Alamin at work. If BP still above 140/90 next week will increase lisinopril to 15mg  po daily. She is to notify us if worsening palpitations, new cough, headache, dizziness, chest pain, nausea or vomiting or fatigue. Encouraged her to continue active lifestyle and healthy diet choices.Patient verbalized understanding information/instructions, agreed with plan of care and had no further questions at this time.

## 2020-12-05 ENCOUNTER — Other Ambulatory Visit: Payer: Self-pay | Admitting: Internal Medicine

## 2020-12-08 ENCOUNTER — Other Ambulatory Visit: Payer: Self-pay | Admitting: Internal Medicine

## 2020-12-08 NOTE — Telephone Encounter (Signed)
Notes to clinic:  medication filled by a different provider  Review for refill    Requested Prescriptions  Pending Prescriptions Disp Refills   amLODipine (NORVASC) 5 MG tablet [Pharmacy Med Name: AMLODIPINE BESYLATE 5 MG TAB] 180 tablet 0    Sig: TAKE 1 TABLET BY MOUTH TWICE A DAY      Cardiovascular:  Calcium Channel Blockers Failed - 12/08/2020  3:07 PM      Failed - Last BP in normal range    BP Readings from Last 1 Encounters:  11/18/20 (!) 182/118          Failed - Valid encounter within last 6 months    Recent Outpatient Visits   None     Future Appointments             In 3 weeks Baity, Coralie Keens, NP [facility]               hydrochlorothiazide (HYDRODIURIL) 25 MG tablet [Pharmacy Med Name: HYDROCHLOROTHIAZIDE 25 MG TAB] 90 tablet 0    Sig: TAKE 1 TABLET BY MOUTH EVERY MORNING      Cardiovascular: Diuretics - Thiazide Failed - 12/08/2020  3:07 PM      Failed - Last BP in normal range    BP Readings from Last 1 Encounters:  11/18/20 (!) 182/118          Failed - Valid encounter within last 6 months    Recent Outpatient Visits   None     Future Appointments             In 3 weeks Baity, Coralie Keens, NP [facility], [place] in normal range and within 360 days    Calcium  Date Value Ref Range Status  03/14/2020 9.5 8.7 - 10.2 mg/dL Final   Calcium, Ion  Date Value Ref Range Status  04/27/2010 1.16 1.12 - 1.32 mmol/L Final          Passed - Cr in normal range and within 360 days    Creatinine, Ser  Date Value Ref Range Status  03/14/2020 0.91 0.57 - 1.00 mg/dL Final          Passed - K in normal range and within 360 days    Potassium  Date Value Ref Range Status  03/14/2020 3.7 3.5 - 5.2 mmol/L Final          Passed - Na in normal range and within 360 days    Sodium  Date Value Ref Range Status  03/14/2020 139 134 - 144 mmol/L Final            lisinopril (ZESTRIL) 10 MG  tablet [Pharmacy Med Name: LISINOPRIL 10 MG TAB] 135 tablet     Sig: TAKE 1 AND 1/2 TABLETS BY MOUTH ONCE DAILY.      Cardiovascular:  ACE Inhibitors Failed - 12/08/2020  3:07 PM      Failed - Cr in normal range and within 180 days    Creatinine, Ser  Date Value Ref Range Status  03/14/2020 0.91 0.57 - 1.00 mg/dL Final          Failed - K in normal range and within 180 days    Potassium  Date Value Ref Range Status  03/14/2020 3.7 3.5 - 5.2 mmol/L Final          Failed - Last BP in normal range  BP Readings from Last 1 Encounters:  11/18/20 (!) 182/118          Failed - Valid encounter within last 6 months    Recent Outpatient Visits   None     Future Appointments             In 3 weeks Baity, Coralie Keens, NP [facility], [place] - Patient is not pregnant

## 2020-12-08 NOTE — Telephone Encounter (Signed)
Amlodipine last RF: 05/20/20 #180 HCTZ last RF: 11/18/20 #90 Pt stated she is almost out of this med- should have enough until 02/18/21 Lisinopril Last RF 11/18/20 #180. Pt is > 6 months overdue and frequent cancellations and no shows. Last OV with PCP: 06/03/20.  Pt has upcoming appt on 12/30/19. Please review if Cassie Mills will refill these meds.

## 2020-12-08 NOTE — Telephone Encounter (Signed)
Pt called to report that she is completely out of her hydrochlorothiazide   Requesting a 30 day supply   [employer]

## 2020-12-29 ENCOUNTER — Other Ambulatory Visit: Payer: Self-pay

## 2020-12-29 ENCOUNTER — Encounter: Payer: Self-pay | Admitting: Internal Medicine

## 2020-12-29 ENCOUNTER — Ambulatory Visit (INDEPENDENT_AMBULATORY_CARE_PROVIDER_SITE_OTHER): Payer: No Typology Code available for payment source | Admitting: Internal Medicine

## 2020-12-29 VITALS — BP 152/81 | HR 70 | Temp 97.7°F | Resp 17 | Ht 65.0 in | Wt 255.0 lb

## 2020-12-29 DIAGNOSIS — D500 Iron deficiency anemia secondary to blood loss (chronic): Secondary | ICD-10-CM | POA: Diagnosis not present

## 2020-12-29 DIAGNOSIS — I10 Essential (primary) hypertension: Secondary | ICD-10-CM | POA: Diagnosis not present

## 2020-12-29 DIAGNOSIS — Z1159 Encounter for screening for other viral diseases: Secondary | ICD-10-CM

## 2020-12-29 DIAGNOSIS — F32A Depression, unspecified: Secondary | ICD-10-CM

## 2020-12-29 DIAGNOSIS — Z0001 Encounter for general adult medical examination with abnormal findings: Secondary | ICD-10-CM

## 2020-12-29 DIAGNOSIS — E66813 Obesity, class 3: Secondary | ICD-10-CM | POA: Insufficient documentation

## 2020-12-29 MED ORDER — HYDROCHLOROTHIAZIDE 25 MG PO TABS: 25.0000 mg | ORAL_TABLET | Freq: Every morning | ORAL | 3 refills | Status: DC

## 2020-12-29 MED ORDER — AMLODIPINE BESYLATE 5 MG PO TABS: 5.0000 mg | ORAL_TABLET | Freq: Two times a day (BID) | ORAL | 3 refills | Status: DC

## 2020-12-29 MED ORDER — LISINOPRIL 10 MG PO TABS: 15.0000 mg | ORAL_TABLET | Freq: Every day | ORAL | 3 refills | Status: DC

## 2020-12-29 NOTE — Assessment & Plan Note (Signed)
CBC today Continue oral iron 

## 2020-12-29 NOTE — Patient Instructions (Signed)
Health Maintenance, Female Adopting a healthy lifestyle and getting preventive care are important in promoting health and wellness. Ask your health care provider about: The right schedule for you to have regular tests and exams. Things you can do on your own to prevent diseases and keep yourself healthy. What should I know about diet, weight, and exercise? Eat a healthy diet  Eat a diet that includes plenty of vegetables, fruits, low-fat dairy products, and lean protein. Do not eat a lot of foods that are high in solid fats, added sugars, or sodium.  Maintain a healthy weight Body mass index (BMI) is used to identify weight problems. It estimates body fat based on height and weight. Your health care provider can help determineyour BMI and help you achieve or maintain a healthy weight. Get regular exercise Get regular exercise. This is one of the most important things you can do for your health. Most adults should: Exercise for at least 150 minutes each week. The exercise should increase your heart rate and make you sweat (moderate-intensity exercise). Do strengthening exercises at least twice a week. This is in addition to the moderate-intensity exercise. Spend less time sitting. Even light physical activity can be beneficial. Watch cholesterol and blood lipids Have your blood tested for lipids and cholesterol at 47 years of age, then havethis test every 5 years. Have your cholesterol levels checked more often if: Your lipid or cholesterol levels are high. You are older than 47 years of age. You are at high risk for heart disease. What should I know about cancer screening? Depending on your health history and family history, you may need to have cancer screening at various ages. This may include screening for: Breast cancer. Cervical cancer. Colorectal cancer. Skin cancer. Lung cancer. What should I know about heart disease, diabetes, and high blood pressure? Blood pressure and heart  disease High blood pressure causes heart disease and increases the risk of stroke. This is more likely to develop in people who have high blood pressure readings, are of African descent, or are overweight. Have your blood pressure checked: Every 3-5 years if you are 18-39 years of age. Every year if you are 40 years old or older. Diabetes Have regular diabetes screenings. This checks your fasting blood sugar level. Have the screening done: Once every three years after age 40 if you are at a normal weight and have a low risk for diabetes. More often and at a younger age if you are overweight or have a high risk for diabetes. What should I know about preventing infection? Hepatitis B If you have a higher risk for hepatitis B, you should be screened for this virus. Talk with your health care provider to find out if you are at risk forhepatitis B infection. Hepatitis C Testing is recommended for: Everyone born from 1945 through 1965. Anyone with known risk factors for hepatitis C. Sexually transmitted infections (STIs) Get screened for STIs, including gonorrhea and chlamydia, if: You are sexually active and are younger than 47 years of age. You are older than 47 years of age and your health care provider tells you that you are at risk for this type of infection. Your sexual activity has changed since you were last screened, and you are at increased risk for chlamydia or gonorrhea. Ask your health care provider if you are at risk. Ask your health care provider about whether you are at high risk for HIV. Your health care provider may recommend a prescription medicine to help   prevent HIV infection. If you choose to take medicine to prevent HIV, you should first get tested for HIV. You should then be tested every 3 months for as long as you are taking the medicine. Pregnancy If you are about to stop having your period (premenopausal) and you may become pregnant, seek counseling before you get  pregnant. Take 400 to 800 micrograms (mcg) of folic acid every day if you become pregnant. Ask for birth control (contraception) if you want to prevent pregnancy. Osteoporosis and menopause Osteoporosis is a disease in which the bones lose minerals and strength with aging. This can result in bone fractures. If you are 65 years old or older, or if you are at risk for osteoporosis and fractures, ask your health care provider if you should: Be screened for bone loss. Take a calcium or vitamin D supplement to lower your risk of fractures. Be given hormone replacement therapy (HRT) to treat symptoms of menopause. Follow these instructions at home: Lifestyle Do not use any products that contain nicotine or tobacco, such as cigarettes, e-cigarettes, and chewing tobacco. If you need help quitting, ask your health care provider. Do not use street drugs. Do not share needles. Ask your health care provider for help if you need support or information about quitting drugs. Alcohol use Do not drink alcohol if: Your health care provider tells you not to drink. You are pregnant, may be pregnant, or are planning to become pregnant. If you drink alcohol: Limit how much you use to 0-1 drink a day. Limit intake if you are breastfeeding. Be aware of how much alcohol is in your drink. In the [place] one drink equals one 12 oz bottle of beer (355 mL), one 5 oz glass of wine (148 mL), or one 1 oz glass of hard liquor (44 mL). General instructions Schedule regular health, dental, and eye exams. Stay current with your vaccines. Tell your health care provider if: You often feel depressed. You have ever been abused or do not feel safe at home. Summary Adopting a healthy lifestyle and getting preventive care are important in promoting health and wellness. Follow your health care provider's instructions about healthy diet, exercising, and getting tested or screened for diseases. Follow your health care provider's  instructions on monitoring your cholesterol and blood pressure. This information is not intended to replace advice given to you by your health care provider. Make sure you discuss any questions you have with your healthcare provider. Document Revised: 05/31/2018 Document Reviewed: 05/31/2018 Elsevier Patient Education  2022 [employer]  

## 2020-12-29 NOTE — Assessment & Plan Note (Signed)
Encouraged diet and exercise for weight loss ?

## 2020-12-29 NOTE — Assessment & Plan Note (Signed)
Noncompliant with medication regimen Advised her to be more consistent Lisinopril, HCTZ and Amlodipine refilled today Reinforced DASH diet and exercise for weight loss CMET today

## 2020-12-29 NOTE — Assessment & Plan Note (Signed)
Currently not an issue Will monitor 

## 2020-12-29 NOTE — Progress Notes (Signed)
Subjective:    Patient ID: Cassie Mills, female    DOB: Mar 14, 1976, 45 y.o.   MRN: [id number]  HPI  Pt presents to the clinic today for her annual exam. She is also due to follow up chronic conditions.  HTN: Her BP today is 152/81. She is taking Amlodipine, Lisonopril and HCTZ (not taking consistently). There is no ECG on file.  Iron Deficiency Anemia: Secondary to fibroids. Her last H/H was 12.3/38.1, 02/2020. She is taking iron daily.  Depression: Currently not an issue, off meds. She is not currently seeing a therapist. She denies SI/HI.  Flu: never Tetanus: > 10 years ago Covid: [place] x 3 Pap smear: 02/2014, [facility] Mammogram: [facility] Colon screening: never Vision screening: as needed Dentist: as needed  Diet: She does eat meat. She consumes fruits and veggies. She does eat some fried foods. She drinks mostly soda. Exercise: None  Review of Systems  Past Medical History:  Diagnosis Date   Anemia    Childhood asthma    Heart murmur    History of chicken pox 1984   Hypertension    Menorrhagia    due to fibroids   Uterine fibroid     Current Outpatient Medications  Medication Sig Dispense Refill   amLODipine (NORVASC) 5 MG tablet TAKE 1 TABLET BY MOUTH TWICE A DAY 60 tablet 0   ferrous sulfate 325 (65 FE) MG tablet Take 325 mg by mouth daily with breakfast.     hydrochlorothiazide (HYDRODIURIL) 25 MG tablet TAKE 1 TABLET BY MOUTH EVERY MORNING 30 tablet 0   lisinopril (ZESTRIL) 10 MG tablet TAKE 1 AND 1/2 TABLETS BY MOUTH ONCE DAILY. 45 tablet 0   No current facility-administered medications for this visit.    No Known Allergies  Family History  Problem Relation Age of Onset   Arthritis Mother    Hypertension Mother    Kidney disease Mother    Diabetes Mother    Diabetes Maternal Aunt    Kidney disease Maternal Aunt    Diabetes Maternal Uncle    Kidney disease Maternal Uncle    Heart failure Paternal Aunt    CAD Paternal Uncle     Cancer Father        throat (smoker)   Cancer Paternal Grandmother        throat (smoker)   Stroke Neg Hx     Social History   Socioeconomic History   Marital status: Single    Spouse name: Not on file   Number of children: Not on file   Years of education: Not on file   Highest education level: Not on file  Occupational History   Not on file  Tobacco Use   Smoking status: Never   Smokeless tobacco: Never  Substance and Sexual Activity   Alcohol use: No   Drug use: No   Sexual activity: Yes  Other Topics Concern   Not on file  Social History Narrative   Lives with mother   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  HEENT: Denies eye pain, eye redness, ear pain, ringing in the ears, wax buildup, runny nose, nasal congestion, bloody nose, or sore throat. Respiratory: Denies difficulty breathing, shortness of breath, cough or sputum production.   Cardiovascular: Denies chest pain, chest tightness, palpitations or swelling in the hands or feet.  Gastrointestinal: Pt reports intermittent abdominal pain from fibroids. Denies abdominal pain, bloating, constipation, diarrhea or blood in the stool.  GU: Denies urgency, frequency, pain with urination, burning sensation, blood in urine, odor or discharge. Musculoskeletal: Pt reports intermittent right ankle pain. Denies decrease in range of motion, difficulty with gait, muscle pain or joint swelling.  Skin: Denies redness, rashes, lesions or ulcercations.  Neurological: Denies dizziness,  difficulty with memory, difficulty with speech or problems with balance and coordination.  Psych: Pt has a history of depression. Denies anxiety, SI/HI.  No other specific complaints in a complete review of systems (except as listed in HPI above).     Objective:   Physical Exam BP (!) 152/81 (BP Location: Right Arm, Patient Position: Sitting, Cuff Size: Large)   Pulse 70   Temp 97.7 F (36.5 C) (Temporal)   Resp 17   Ht 5\' 5"  (1.651 m)   Wt 255 lb (115.7 kg)   SpO2 100%   BMI 42.43 kg/m   Wt Readings from Last 3 Encounters:  06/03/20 221 lb (100.2 kg)  11/01/19 238 lb (108 kg)  10/09/19 242 lb (109.8 kg)    General: Appears her stated age, obese, in NAD. Skin: Warm, dry and intact. No rashes noted. HEENT: Head: normal shape and size; Eyes: sclera white and EOMs intact;  Neck:  Neck supple, trachea midline. No masses, lumps present. Thyromegaly noted. Cardiovascular: Normal rate and rhythm. S1,S2 noted.  No murmur, rubs or gallops noted. No JVD or BLE edema.  Pulmonary/Chest: Normal effort and positive vesicular breath sounds. No respiratory distress. No wheezes, rales or ronchi noted.  Abdomen: Soft and nontender. Normal bowel sounds. No distention or masses noted. Liver, spleen and kidneys non palpable. Musculoskeletal: Strength 5/5 BUE/BLE. No difficulty with gait.  Neurological: Alert and oriented. Cranial nerves II-XII grossly intact. Coordination normal.  Psychiatric: Mood and affect normal. Behavior is normal. Judgment and thought content normal.    BMET    Component Value Date/Time   NA 139 03/14/2020 0949   K 3.7 03/14/2020 0949   CL 98 03/14/2020 0949   CO2 24 01/07/2014 0917   GLUCOSE 85 03/14/2020 0949   GLUCOSE 72 01/07/2014 0917   BUN 11 03/14/2020 0949   CREATININE 0.91 03/14/2020 0949   CALCIUM 9.5 03/14/2020 0949   GFRNONAA 76 03/14/2020 0949   GFRAA 88 03/14/2020 0949    Lipid Panel     Component Value Date/Time   CHOL 158 03/14/2020 0949    TRIG 42 03/14/2020 0949   HDL 74 03/14/2020 0949   CHOLHDL 2.1 03/14/2020 0949   CHOLHDL 3 01/07/2014 0917   VLDL 12.8 01/07/2014 0917   LDLCALC 75 03/14/2020 0949    CBC    Component Value Date/Time   WBC 3.9 03/14/2020 0949   WBC 5.0 01/07/2014 0917   RBC 4.96 03/14/2020 0949   RBC 4.28 01/07/2014 0917   HGB 12.3 03/14/2020 0949   HCT 38.1 03/14/2020 0949   PLT 221 03/14/2020 0949   MCV 77 (L) 03/14/2020 0949   MCH 24.8 (L) 03/14/2020 0949   MCHC 32.3 03/14/2020 0949   MCHC 32.3 01/07/2014 0917   RDW 19.8 (H) 03/14/2020  0949   LYMPHSABS 1.1 03/14/2020 0949   MONOABS 0.4 01/07/2014 0917   EOSABS 0.1 03/14/2020 0949   BASOSABS 0.0 03/14/2020 0949    Hgb A1C Lab Results  Component Value Date   HGBA1C 5.5 03/14/2020            Assessment & Plan:   Preventative Health Maintenance:  Encouraged her to get a flu shot in the fall She declines tetanus booster today Covid vaccine UTD Pap smear UTD per her reports, will request copy Mammogram UTD per her reports, will request copy She declines referral to GI for screening colonoscopy Encouraged her to consume a balanced diet and and exercise regimen Advised her to see an eye doctor and dentist annually Will check CBC, CMET,TSH, Lipid, A1C and Hep C today  RTC in 1 year, sooner if needed   Webb Silversmith, NP This visit occurred during the SARS-CoV-2 public health emergency.  Safety protocols were in place, including screening questions prior to the visit, additional usage of staff PPE, and extensive cleaning of exam room while observing appropriate contact time as indicated for disinfecting solutions.

## 2020-12-30 LAB — CBC
HCT: 38.2 % (ref 35.0–45.0)
Hemoglobin: 12.3 g/dL (ref 11.7–15.5)
MCH: 26 pg — ABNORMAL LOW (ref 27.0–33.0)
MCHC: 32.2 g/dL (ref 32.0–36.0)
MCV: 80.8 fL (ref 80.0–100.0)
MPV: 10.5 fL (ref 7.5–12.5)
Platelets: 195 10*3/uL (ref 140–400)
RBC: 4.73 10*6/uL (ref 3.80–5.10)
RDW: 19.4 % — ABNORMAL HIGH (ref 11.0–15.0)
WBC: 4.1 10*3/uL (ref 3.8–10.8)

## 2020-12-30 LAB — COMPLETE METABOLIC PANEL WITH GFR
AG Ratio: 1.7 (calc) (ref 1.0–2.5)
ALT: 13 U/L (ref 6–29)
AST: 14 U/L (ref 10–35)
Albumin: 4 g/dL (ref 3.6–5.1)
Alkaline phosphatase (APISO): 57 U/L (ref 31–125)
BUN: 11 mg/dL (ref 7–25)
CO2: 28 mmol/L (ref 20–32)
Calcium: 9.1 mg/dL (ref 8.6–10.2)
Chloride: 106 mmol/L (ref 98–110)
Creat: 0.9 mg/dL (ref 0.50–0.99)
Globulin: 2.4 g/dL (calc) (ref 1.9–3.7)
Glucose, Bld: 90 mg/dL (ref 65–99)
Potassium: 3.9 mmol/L (ref 3.5–5.3)
Sodium: 140 mmol/L (ref 135–146)
Total Bilirubin: 0.3 mg/dL (ref 0.2–1.2)
Total Protein: 6.4 g/dL (ref 6.1–8.1)
eGFR: 80 mL/min/{1.73_m2} (ref >=60)

## 2020-12-30 LAB — HEMOGLOBIN A1C
Hgb A1c MFr Bld: 5.1 % of total Hgb (ref <5.7)
Mean Plasma Glucose: 100 mg/dL
eAG (mmol/L): 5.5 mmol/L

## 2020-12-30 LAB — LIPID PANEL
Cholesterol: 126 mg/dL (ref <200)
HDL: 52 mg/dL (ref >=50)
LDL Cholesterol (Calc): 61 mg/dL (calc)
Non-HDL Cholesterol (Calc): 74 mg/dL (calc) (ref <130)
Total CHOL/HDL Ratio: 2.4 (calc) (ref <5.0)
Triglycerides: 53 mg/dL (ref <150)

## 2020-12-30 LAB — HEPATITIS C ANTIBODY
Hepatitis C Ab: NONREACTIVE
SIGNAL TO CUT-OFF: 0.01 (ref <1.00)

## 2020-12-30 LAB — TSH: TSH: 1.53 mIU/L

## 2021-01-02 ENCOUNTER — Encounter: Payer: Self-pay | Admitting: Registered Nurse

## 2021-01-02 ENCOUNTER — Telehealth: Payer: Self-pay | Admitting: Registered Nurse

## 2021-01-02 NOTE — Telephone Encounter (Signed)
Patient contacted clinic asking if we could see PCM message/lab results to assist her as locked out of my chart account.    Reviewed Epic and PCM Webb Silversmith NP message " Blood counts stable. Thyroid is normal. Liver and kidney function is normal.Cholesterol looks great. No diabetes. Negative for Hep C" given to patient. Follow up with PCM as discussed at her appt routine 1 year   My chart password reset.  Patient verbalized understanding information/instructions, agreed with plan of care and had no further questions at this time.

## 2021-04-14 ENCOUNTER — Other Ambulatory Visit: Payer: Self-pay

## 2021-04-14 ENCOUNTER — Ambulatory Visit: Payer: Self-pay | Admitting: *Deleted

## 2021-04-14 VITALS — BP 138/82

## 2021-04-14 DIAGNOSIS — I10 Essential (primary) hypertension: Secondary | ICD-10-CM

## 2021-04-14 NOTE — Progress Notes (Signed)
Has been [place] BP over past [place]. She cut out caffeine and BP improved. Went back to caffeine and it worsened. Trying to avoid again today as she was having palpitations this morning that resolved quickly. BP much better than her typical 150's/100's. She attributes this to no caffeine today. Typically will have [place] and a med/lrg iced coffee between morning and evening.

## 2021-06-09 ENCOUNTER — Encounter: Payer: Self-pay | Admitting: *Deleted

## 2021-06-09 ENCOUNTER — Telehealth: Payer: Self-pay | Admitting: *Deleted

## 2021-06-09 DIAGNOSIS — U071 COVID-19: Secondary | ICD-10-CM

## 2021-06-09 NOTE — Telephone Encounter (Signed)
Clinic notified that pt called out of work today. Spoke with pt by phone. She reports head and chest congestion that began yesterday 12/19. She is taking Theraflu at home for sx.  Has a PCR covid test scheduled for today at 1115. Does not have any tests at home. Advised pt she can get these at no upfront cost from [facility] with her insurance so if today's test is negative, she can repeat in 48hrs. She also has appt scheduled with pcp Thursday 12/22.  Will f/u with pt tomorrow 12/21 for result and sx check. Plan for pt to be out at least thru 12/22 for repeat testing.

## 2021-06-09 NOTE — Telephone Encounter (Signed)
Confirmed sx actually started Saturday 12/17 with fatigue that she thought was r/t personal medical/medication. Definite sx of head and chest congestion started 12/18.   Day 0 12/18 Day 5 12/23 RTW 12/24 or next scheduled workday 12/26 with strit mask use thru Day 10 12/28  Pt wants to look up more information on covid antivirals. Discussed basics and side effects with her and she will notify NP Otila Kluver tomorrow if she wants to start antivirals. If needed, preferred pharmacy is [facility]. They confirmed 12/20 that they have molnupiravir in stock, not paxlovid. Secondary pharmacy [place].

## 2021-06-09 NOTE — Telephone Encounter (Signed)
Reviewed RN Hildred Alamin notes and case discussed in clinic earlier today.  My chart message sent with molnupiravir patient information, covid home symptom management and quarantine handouts.  Agreed with plan of care

## 2021-06-10 NOTE — Telephone Encounter (Signed)
Patient contacted via telephone A&Ox3 spoke full sentences without difficulty hoarse voice and congestion audible.during  4 minute telephone call.  Cough bothering her the most.  Discussed dayquil/nyquil/honey 1 tablespoon q4h prn cough.  She has been very fatigued also/napping/sleeping.  Has not read antiviral covid information.  Feels like this just a bad cold and does not want antivirals at this time.  Discussed RN Hildred Alamin will follow up with her tomorrow. Discussed ensure she is staying hydrating as losing more fluids with cough/mucous and can get dehydrated.  HR notified patient continues symptomatic/quarantine and re-evaluation with RN Hildred Alamin later this week.  Patient verbalized understanding information/instructions, agreed with plan of care and had no further questions at this time.

## 2021-06-11 ENCOUNTER — Ambulatory Visit: Payer: No Typology Code available for payment source | Admitting: Internal Medicine

## 2021-06-12 NOTE — Telephone Encounter (Signed)
Spoke with pt by phone. Feeling a little better today. Stuffy nose still present. Fatigue improved. Cough intermittent but less frequent. Still taking Dayquil/Nyquil. Weekend f/u for final clearance to RTW 12/26. Today is Day 5.

## 2021-06-14 NOTE — Telephone Encounter (Signed)
Patient contacted via telephone spoke full sentences without difficulty A&Ox3 no cough/throat clearing audible during 8 minute telephone call.  Has theraflu and nasal saline at home using prn.  Symptoms improved. Patient unsure if she is using nasal saline correctly went over administration procedures with her.  Patient lives alone.  Discussed meditation room/doreesa's  office can be used for eating lunch/break snacks and straw under mask for beverages at workstation only.   Patient asked if she could get covid antivirals today.  Discussed today Day 7 and antivirals need to be started before Day 5.  Patient to wear mask at work and no eating in employee lunch room through Day 10 06/17/21.  HR notified.  Patient verbalized understanding information/instructions, agreed with plan of care and had no further questions at this time.

## 2021-06-15 NOTE — Telephone Encounter (Signed)
Reviewed RN Hildred Alamin note patient RTW as expected 06/15/21 will follow up 12/29 Day 11

## 2021-06-15 NOTE — Telephone Encounter (Signed)
Spoke with pt by phone. Did RTW today 12/26 as expected. Masking thru Day 10 12/28. Closing encounter.

## 2021-06-28 NOTE — Telephone Encounter (Signed)
Patient seen in workcenter denied questions or concerns.  A&Ox3 spoke full sentences without difficulty gait sure and steady  skin warm dry and pink respirations even and unlabored.  No audible cough/congestion or throat clearing.  Encounter closed.

## 2021-07-08 ENCOUNTER — Other Ambulatory Visit: Payer: Self-pay | Admitting: Obstetrics and Gynecology

## 2021-07-08 DIAGNOSIS — Z1231 Encounter for screening mammogram for malignant neoplasm of breast: Secondary | ICD-10-CM

## 2021-07-14 ENCOUNTER — Ambulatory Visit
Admission: RE | Admit: 2021-07-14 | Discharge: 2021-07-14 | Disposition: A | Discharge status: Home / Self Care | Payer: No Typology Code available for payment source | Source: Ambulatory Visit | Attending: Obstetrics and Gynecology | Admitting: Obstetrics and Gynecology

## 2021-07-14 ENCOUNTER — Other Ambulatory Visit: Payer: Self-pay

## 2021-07-14 DIAGNOSIS — Z1231 Encounter for screening mammogram for malignant neoplasm of breast: Secondary | ICD-10-CM

## 2021-09-09 ENCOUNTER — Ambulatory Visit (INDEPENDENT_AMBULATORY_CARE_PROVIDER_SITE_OTHER): Payer: No Typology Code available for payment source | Admitting: Internal Medicine

## 2021-09-09 ENCOUNTER — Other Ambulatory Visit: Payer: Self-pay

## 2021-09-09 ENCOUNTER — Encounter: Payer: Self-pay | Admitting: Internal Medicine

## 2021-09-09 VITALS — BP 122/78 | HR 70 | Temp 97.3°F | Wt 243.0 lb

## 2021-09-09 DIAGNOSIS — G8929 Other chronic pain: Secondary | ICD-10-CM

## 2021-09-09 DIAGNOSIS — M542 Cervicalgia: Secondary | ICD-10-CM

## 2021-09-09 DIAGNOSIS — G44221 Chronic tension-type headache, intractable: Secondary | ICD-10-CM

## 2021-09-09 MED ORDER — CYCLOBENZAPRINE HCL 5 MG PO TABS: 5.0000 mg | ORAL_TABLET | Freq: Every evening | ORAL | 0 refills | Status: DC | PRN

## 2021-09-09 NOTE — Progress Notes (Signed)
? ?Subjective:  ? ? Patient ID: Cassie Mills, female    DOB: [birth year], 46 y.o.   MRN: [id number] ? ?HPI ? ?Patient presents to clinic today with complaint of headaches.  These occur daily.  They are located left posterior scalp.  She describes the pain as achy and throbbing.  She reports associated neck pain.  She denies dizziness, vision changes, sensitivity to light, sound, nausea or vomiting. She denies any neck injury but has had neck pain for a few years. She has tried Ibuprofen OTC with minimal relief of systems. ? ?Review of Systems ? ?   ?Past Medical History:  ?Diagnosis Date  ? Anemia   ? Childhood asthma   ? Heart murmur   ? History of chicken pox 1984  ? Hypertension   ? Menorrhagia   ? due to fibroids  ? Uterine fibroid   ? ? ?Current Outpatient Medications  ?Medication Sig Dispense Refill  ? amLODipine (NORVASC) 5 MG tablet Take 1 tablet (5 mg total) by mouth 2 (two) times daily. 180 tablet 3  ? ferrous sulfate 325 (65 FE) MG tablet Take 325 mg by mouth daily with breakfast.    ? hydrochlorothiazide (HYDRODIURIL) 25 MG tablet Take 1 tablet (25 mg total) by mouth every morning. 90 tablet 3  ? lisinopril (ZESTRIL) 10 MG tablet Take 1.5 tablets (15 mg total) by mouth daily. 135 tablet 3  ? ?No current facility-administered medications for this visit.  ? ? ?No Known Allergies ? ?Family History  ?Problem Relation Age of Onset  ? Arthritis Mother   ? Hypertension Mother   ? Kidney disease Mother   ? Diabetes Mother   ? Cancer Father   ?     throat (smoker)  ? Diabetes Maternal Aunt   ? Kidney disease Maternal Aunt   ? Diabetes Maternal Uncle   ? Kidney disease Maternal Uncle   ? Heart failure Paternal Aunt   ? CAD Paternal Uncle   ? Cancer Paternal Grandmother   ?     throat (smoker)  ? Stroke Neg Hx   ? Breast cancer Neg Hx   ? ? ?Social History  ? ?Socioeconomic History  ? Marital status: Single  ?  Spouse name: Not on file  ? Number of children: Not on file  ? Years of education: Not on file  ?  Highest education level: Not on file  ?Occupational History  ? Not on file  ?Tobacco Use  ? Smoking status: Never  ? Smokeless tobacco: Never  ?Substance and Sexual Activity  ? Alcohol use: No  ? Drug use: No  ? Sexual activity: Yes  ?Other Topics Concern  ? Not on file  ?Social History Narrative  ? Lives with mother  ? 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 Edu: comm college  ? Activity: no regular exercise  ? Diet: some water, vegetables daily  ? ?Social Determinants of Health  ? ?Financial Resource Strain: Not on file  ?Food Insecurity: Not on file  ?Transportation Needs: Not on file  ?Physical Activity: Not on file  ?Stress: Not on file  ?Social Connections: Not on file  ?Intimate Partner Violence: Not on file  ? ? ? ?Constitutional: Patient reports headaches.  Denies fever, malaise, fatigue, or abrupt weight changes.  ?HEENT: Denies eye pain, eye redness, ear pain, ringing in the ears, wax buildup, runny nose, nasal congestion, bloody nose, or sore throat. ?Respiratory: Denies difficulty breathing, shortness of breath, cough or sputum production.   ?  Cardiovascular: Denies chest pain, chest tightness, palpitations or swelling in the hands or feet.  ?Musculoskeletal: Patient reports neck pain.  Denies decrease in range of motion, difficulty with gait, muscle pain or joint swelling.  ?Skin: Denies redness, rashes, lesions or ulcercations.  ?Neurological: Denies dizziness, difficulty with memory, difficulty with speech or problems with balance and coordination.  ? ? ?No other specific complaints in a complete review of systems (except as listed in HPI above). ? ?Objective:  ? Physical Exam ? ? ?BP 122/78 (BP Location: Right Arm, Patient Position: Sitting, Cuff Size: Large)   Pulse 70   Temp (!) 97.3 ?F (36.3 ?C) (Temporal)   Wt 243 lb (110.2 kg)   SpO2 100%   BMI 40.44 kg/m?  ? ?Wt Readings from Last 3 Encounters:  ?12/29/20 255 lb (115.7 kg)  ?06/03/20 221 lb (100.2 kg)  ?11/01/19 238 lb (108 kg)   ? ? ?General: Appears her stated age, obese, in NAD. ?Skin: Warm, dry and intact. No rashes noted. ?HEENT: Head: normal shape and size; Eyes: sclera white, PERRLA and EOMs intact;  ?Cardiovascular: Normal rate and rhythm. S1,S2 noted.  No murmur, rubs or gallops noted.  ?Pulmonary/Chest: Normal effort and positive vesicular breath sounds. No respiratory distress. No wheezes, rales or ronchi noted.  ?Musculoskeletal: Normal flexion, extension, rotation and lateral bending of the cervical spine.  No bony tenderness noted over the cervical spine.  No pain with palpation of the left paracervical muscles.  Shoulder shrug equal.  Handgrips equal.  No difficulty with gait.  ?Neurological: Alert and oriented. Coordination normal.  ? ? ?BMET ?   ?Component Value Date/Time  ? NA 140 12/29/2020 0849  ? NA 139 03/14/2020 0949  ? K 3.9 12/29/2020 0849  ? CL 106 12/29/2020 0849  ? CO2 28 12/29/2020 0849  ? GLUCOSE 90 12/29/2020 0849  ? BUN 11 12/29/2020 0849  ? BUN 11 03/14/2020 0949  ? CREATININE 0.90 12/29/2020 0849  ? CALCIUM 9.1 12/29/2020 0849  ? GFRNONAA 76 03/14/2020 0949  ? GFRAA 88 03/14/2020 0949  ? ? ?Lipid Panel  ?   ?Component Value Date/Time  ? CHOL 126 12/29/2020 0849  ? CHOL 158 03/14/2020 0949  ? TRIG 53 12/29/2020 0849  ? HDL 52 12/29/2020 0849  ? HDL 74 03/14/2020 0949  ? CHOLHDL 2.4 12/29/2020 0849  ? VLDL 12.8 01/07/2014 0917  ? [place] 61 12/29/2020 0849  ? ? ?CBC ?   ?Component Value Date/Time  ? WBC 4.1 12/29/2020 0849  ? RBC 4.73 12/29/2020 0849  ? HGB 12.3 12/29/2020 0849  ? HGB 12.3 03/14/2020 0949  ? HCT 38.2 12/29/2020 0849  ? HCT 38.1 03/14/2020 0949  ? PLT 195 12/29/2020 0849  ? PLT 221 03/14/2020 0949  ? MCV 80.8 12/29/2020 0849  ? MCV 77 (L) 03/14/2020 0949  ? MCH 26.0 (L) 12/29/2020 0849  ? MCHC 32.2 12/29/2020 0849  ? RDW 19.4 (H) 12/29/2020 0849  ? RDW 19.8 (H) 03/14/2020 0949  ? LYMPHSABS 1.1 03/14/2020 0949  ? MONOABS 0.4 01/07/2014 0917  ? EOSABS 0.1 03/14/2020 0949  ? BASOSABS 0.0  03/14/2020 0949  ? ? ?Hgb A1C ?Lab Results  ?Component Value Date  ? HGBA1C 5.1 12/29/2020  ? ? ? ? ? ? ?   ?Assessment & Plan:  ? ?Tension Headaches: ? 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 ? ?Exam ?Webb Silversmith, NP ?This visit occurred during the SARS-CoV-2 public health emergency.  Safety protocols were in place, including screening questions prior to the visit, additional usage of staff PPE, and extensive cleaning of exam room while observing appropriate contact time as indicated for disinfecting solutions.  ? ?

## 2021-09-09 NOTE — Patient Instructions (Signed)
Tension Headache, Adult ?A tension headache is a feeling of pain, pressure, or aching over the front and sides of the head. The pain can be dull, or it can feel tight. There are two types of tension headache: ?Episodic tension headache. This is when the headaches happen fewer than 15 days a month. ?Chronic tension headache. This is when the headaches happen more than 15 days a month during a 3-month period. ?A tension headache can last from 30 minutes to several days. It is the most common kind of headache. Tension headaches are not normally associated with nausea or vomiting, and they do not get worse with physical activity. ?What are the causes? ?The exact cause of this condition is not known. Tension headaches are often triggered by stress, anxiety, or depression. Other triggers may include: ?Alcohol. ?Too much caffeine or caffeine withdrawal. ?Respiratory infections, such as colds, flu, or sinus infections. ?Dental problems or teeth clenching. ?Fatigue. ?Holding your head and neck in the same position for a long period of time, such as while using a computer. ?Smoking. ?Arthritis of the neck. ?What are the signs or symptoms? ?Symptoms of this condition include: ?A feeling of pressure or tightness around the head. ?Dull, aching head pain. ?Pain over the front and sides of the head. ?Tenderness in the muscles of the head, neck, and shoulders. ?How is this diagnosed? ?This condition may be diagnosed based on your symptoms, your medical history, and a physical exam. ?If your symptoms are severe or unusual, you may have imaging tests, such as a CT scan or an MRI of your head. Your vision may also be checked. ?How is this treated? ?This condition may be treated with lifestyle changes and with medicines that help relieve symptoms. ?Follow these instructions at home: ?Managing pain ?Take over-the-counter and prescription medicines only as told by your health care provider. ?When you have a headache, lie down in a dark,  quiet room. ?If directed, put ice on your head and neck. To do this: ?Put ice in a plastic bag. ?Place a towel between your skin and the bag. ?Leave the ice on for 20 minutes, 2-3 times a day. ?Remove the ice if your skin turns bright red. This is very important. If you cannot feel pain, heat, or cold, you have a greater risk of damage to the area. ?If directed, apply heat to the back of your neck as often as told by your health care provider. Use the heat source that your health care provider recommends, such as a moist heat pack or a heating pad. ?Place a towel between your skin and the heat source. ?Leave the heat on for 20-30 minutes. ?Remove the heat if your skin turns bright red. This is especially important if you are unable to feel pain, heat, or cold. You have a greater risk of getting burned. ?Eating and drinking ?Eat meals on a regular schedule. ?If you drink alcohol: ?Limit how much you have to: ?0-1 drink a day for women who are not pregnant. ?0-2 drinks a day for men. ?Know how much alcohol is in your drink. In the [place] one drink equals one 12 oz bottle of beer (355 mL), one 5 oz glass of wine (148 mL), or one 1? oz glass of hard liquor (44 mL). ?Drink enough fluid to keep your urine pale yellow. ?Decrease your caffeine intake, or stop using caffeine. ?Lifestyle ?Get 7-9 hours of sleep each night, or get the amount of sleep recommended by your health care provider. ?At bedtime,   remove computers, phones, and tablets from your room. ?Find ways to manage your stress. This may include: ?Exercise. ?Deep breathing exercises. ?Yoga. ?Listening to music. ?Positive mental imagery. ?Try to sit up straight and avoid tensing your muscles. ?Do not use any products that contain nicotine or tobacco. These include cigarettes, chewing tobacco, and vaping devices, such as e-cigarettes. If you need help quitting, ask your health care provider. ?General instructions ? ?Avoid any headache triggers. Keep a journal to help  find out what may trigger your headaches. For example, write down: ?What you eat and drink. ?How much sleep you get. ?Any change to your diet or medicines. ?Keep all follow-up visits. This is important. ?Contact a health care provider if: ?Your headache does not get better. ?Your headache comes back. ?You are sensitive to sounds, light, or smells because of a headache. ?You have nausea or you vomit. ?Your stomach hurts. ?Get help right away if: ?You suddenly develop a severe headache, along with any of the following: ?A stiff neck. ?Nausea and vomiting. ?Confusion. ?Weakness in one part or one side of your body. ?Double vision or loss of vision. ?Shortness of breath. ?Rash. ?Unusual sleepiness. ?Fever or chills. ?Trouble speaking. ?Pain in your eye or ear. ?Trouble walking or balancing. ?Feeling faint or passing out. ?Summary ?A tension headache is a feeling of pain, pressure, or aching over the front and sides of the head. ?A tension headache can last from 30 minutes to several days. It is the most common kind of headache. ?This condition may be diagnosed based on your symptoms, your medical history, and a physical exam. ?This condition may be treated with lifestyle changes and with medicines that help relieve symptoms. ?This information is not intended to replace advice given to you by your health care provider. Make sure you discuss any questions you have with your health care provider. ?Document Revised: 03/06/2020 Document Reviewed: 03/06/2020 ?Elsevier Patient Education ? 2022 [employer] ? ?

## 2021-09-28 ENCOUNTER — Other Ambulatory Visit: Payer: Self-pay | Admitting: Internal Medicine

## 2021-09-29 NOTE — Telephone Encounter (Signed)
Requested medications are due for refill today.  yes ? ?Requested medications are on the active medications list.  yes ? ?Last refill. 09/09/2021 #20 0 refills ? ?Future visit scheduled.   no ? ?Notes to clinic.  Medication refill is not delegated. ? ? ? ?Requested Prescriptions  ?Pending Prescriptions Disp Refills  ? cyclobenzaprine (FLEXERIL) 5 MG tablet [Pharmacy Med Name: CYCLOBENZAPRINE HCL 5 MG TAB] 20 tablet 0  ?  Sig: TAKE 1 TABLET BY MOUTH EVERY NIGHT AT BEDTIME AS NEEDED FOR MUSCLE SPASMS  ?  ? Not Delegated - Analgesics:  Muscle Relaxants Failed - 09/28/2021  4:46 PM  ?  ?  Failed - This refill cannot be delegated  ?  ?  Passed - Valid encounter within last 6 months  ?  Recent Outpatient Visits   ? ?      ? 2 weeks ago Chronic tension-type headache, intractable  ? [facility] [place], Coralie Keens, NP  ? 9 months ago Encounter for general adult medical examination with abnormal findings  ? [facility] [place], Coralie Keens, NP  ? ?  ?  ? ?  ?  ?  ?  ?

## 2022-03-22 ENCOUNTER — Other Ambulatory Visit: Payer: Self-pay | Admitting: Internal Medicine

## 2022-03-23 NOTE — Telephone Encounter (Signed)
Requested medication (s) are due for refill today: expired medication  Requested medication (s) are on the active medication list: yes  Last refill:  12/29/20 #90 3 refills  Future visit scheduled: no  Notes to clinic:  expired medication. No valid encounter. Last labs 12/29/20. Called paitent to schedule appt for medication refills/annual exam. No answer, LVMTCB. Do you want to give courtesy refill?     Requested Prescriptions  Pending Prescriptions Disp Refills   hydrochlorothiazide (HYDRODIURIL) 25 MG tablet [Pharmacy Med Name: HYDROCHLOROTHIAZIDE 25 MG TAB] 90 tablet 3    Sig: TAKE 1 TABLET BY MOUTH EVERY MORNING     Cardiovascular: Diuretics - Thiazide Failed - 03/22/2022  5:23 PM      Failed - Cr in normal range and within 180 days    Creat  Date Value Ref Range Status  12/29/2020 0.90 0.50 - 0.99 mg/dL Final         Failed - K in normal range and within 180 days    Potassium  Date Value Ref Range Status  12/29/2020 3.9 3.5 - 5.3 mmol/L Final         Failed - Na in normal range and within 180 days    Sodium  Date Value Ref Range Status  12/29/2020 140 135 - 146 mmol/L Final  03/14/2020 139 134 - 144 mmol/L Final         Failed - Valid encounter within last 6 months    Recent Outpatient Visits           6 months ago Chronic tension-type headache, intractable   [place], Coralie Keens, NP   1 year ago Encounter for general adult medical examination with abnormal findings   [facility] [place], Regina W, NP              Passed - Last BP in normal range    BP Readings from Last 1 Encounters:  09/09/21 122/78

## 2022-03-23 NOTE — Telephone Encounter (Signed)
Called patient to schedule appt for medication refills/ annual exam. No answer LVMTCB

## 2022-03-29 ENCOUNTER — Other Ambulatory Visit: Payer: Self-pay | Admitting: Internal Medicine

## 2022-03-30 NOTE — Telephone Encounter (Signed)
Requested medication (s) are due for refill today: yes  Requested medication (s) are on the active medication list: yes    Last refill: 12/29/20 #90  3 refills  Future visit scheduled no  Notes to clinic:Pt needs appt. Attempted to reach pt to secure appt, left VM to call back.  Requested Prescriptions  Pending Prescriptions Disp Refills   hydrochlorothiazide (HYDRODIURIL) 25 MG tablet [Pharmacy Med Name: HYDROCHLOROTHIAZIDE 25 MG TAB] 90 tablet 3    Sig: TAKE 1 TABLET BY MOUTH EVERY MORNING     Cardiovascular: Diuretics - Thiazide Failed - 03/29/2022  1:44 PM      Failed - Cr in normal range and within 180 days    Creat  Date Value Ref Range Status  12/29/2020 0.90 0.50 - 0.99 mg/dL Final         Failed - K in normal range and within 180 days    Potassium  Date Value Ref Range Status  12/29/2020 3.9 3.5 - 5.3 mmol/L Final         Failed - Na in normal range and within 180 days    Sodium  Date Value Ref Range Status  12/29/2020 140 135 - 146 mmol/L Final  03/14/2020 139 134 - 144 mmol/L Final         Failed - Valid encounter within last 6 months    Recent Outpatient Visits           6 months ago Chronic tension-type headache, intractable   [place], Coralie Keens, NP   1 year ago Encounter for general adult medical examination with abnormal findings   [facility] [place], Regina W, NP              Passed - Last BP in normal range    BP Readings from Last 1 Encounters:  09/09/21 122/78

## 2022-08-03 ENCOUNTER — Ambulatory Visit: Payer: No Typology Code available for payment source | Admitting: Internal Medicine

## 2022-09-01 ENCOUNTER — Ambulatory Visit: Payer: No Typology Code available for payment source | Admitting: Internal Medicine

## 2022-09-02 ENCOUNTER — Ambulatory Visit: Payer: No Typology Code available for payment source | Admitting: Internal Medicine

## 2022-09-02 NOTE — Progress Notes (Deleted)
Subjective:    Patient ID: Cassie Mills, female    DOB: 1975-11-29, 47 y.o.   MRN: [id number]  HPI  Patient presents to clinic today for follow-up of chronic conditions.  HTN: Her BP today is.  She is taking Amlodipine, Lisinopril and HCTZ as prescribed.  There is no ECG on file.  Iron Deficiency Anemia: Her last H/H was 12.3/38.2, 12/2020.  She is taking oral Iron as prescribed.  She does not follow with hematology.  Depression: Currently not an issue, off meds.  She is not currently seeing a therapist.  She denies anxiety, SI/HI.  Review of Systems     Past Medical History:  Diagnosis Date   Anemia    Childhood asthma    Heart murmur    History of chicken pox 1984   Hypertension    Menorrhagia    due to fibroids   Uterine fibroid     Current Outpatient Medications  Medication Sig Dispense Refill   amLODipine (NORVASC) 5 MG tablet Take 1 tablet (5 mg total) by mouth 2 (two) times daily. 180 tablet 3   cyclobenzaprine (FLEXERIL) 5 MG tablet Take 1 tablet (5 mg total) by mouth at bedtime as needed for muscle spasms. 20 tablet 0   ferrous sulfate 325 (65 FE) MG tablet Take 325 mg by mouth daily with breakfast.     hydrochlorothiazide (HYDRODIURIL) 25 MG tablet Take 1 tablet (25 mg total) by mouth every morning. 90 tablet 3   lisinopril (ZESTRIL) 10 MG tablet Take 1.5 tablets (15 mg total) by mouth daily. 135 tablet 3   No current facility-administered medications for this visit.    No Known Allergies  Family History  Problem Relation Age of Onset   Arthritis Mother    Hypertension Mother    Kidney disease Mother    Diabetes Mother    Cancer Father        throat (smoker)   Diabetes Maternal Aunt    Kidney disease Maternal Aunt    Diabetes Maternal Uncle    Kidney disease Maternal Uncle    Heart failure Paternal Aunt    CAD Paternal Uncle    Cancer Paternal Grandmother        throat (smoker)   Stroke Neg Hx    Breast cancer Neg Hx     Social History    Socioeconomic History   Marital status: Single    Spouse name: Not on file   Number of children: Not on file   Years of education: Not on file   Highest education level: Not on file  Occupational History   Not on file  Tobacco Use   Smoking status: Never   Smokeless tobacco: Never  Substance and Sexual Activity   Alcohol use: No   Drug use: No   Sexual activity: Yes  Other Topics Concern   Not on file  Social History Narrative   Lives with mother   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  HEENT: Denies eye pain, eye redness,  ear pain, ringing in the ears, wax buildup, runny nose, nasal congestion, bloody nose, or sore throat. Respiratory: Denies difficulty breathing, shortness of breath, cough or sputum production.   Cardiovascular: Denies chest pain, chest tightness, palpitations or swelling in the hands or feet.  Gastrointestinal: Denies abdominal pain, bloating, constipation, diarrhea or blood in the stool.  GU: Denies urgency, frequency, pain with urination, burning sensation, blood in urine, odor or discharge. Musculoskeletal: Denies decrease in range of motion, difficulty with gait, muscle pain or joint pain and swelling.  Skin: Denies redness, rashes, lesions or ulcercations.  Neurological: Denies dizziness, difficulty with memory, difficulty with speech or problems with balance and coordination.  Psych: Patient has a history of depression.  Denies anxiety, SI/HI.  No other specific complaints in a complete review of  systems (except as listed in HPI above).  Objective:   Physical Exam   There were no vitals taken for this visit. Wt Readings from Last 3 Encounters:  09/09/21 243 lb (110.2 kg)  12/29/20 255 lb (115.7 kg)  06/03/20 221 lb (100.2 kg)    General: Appears their stated age, well developed, well nourished in NAD. Skin: Warm, dry and intact. No rashes, lesions or ulcerations noted. HEENT: Head: normal shape and size; Eyes: sclera white, no icterus, conjunctiva pink, PERRLA and EOMs intact; Ears: Tm's gray and intact, normal light reflex; Nose: mucosa pink and moist, septum midline; Throat/Mouth: Teeth present, mucosa pink and moist, no exudate, lesions or ulcerations noted.  Neck:  Neck supple, trachea midline. No masses, lumps or thyromegaly present.  Cardiovascular: Normal rate and rhythm. S1,S2 noted.  No murmur, rubs or gallops noted. No JVD or BLE edema. No carotid bruits noted. Pulmonary/Chest: Normal effort and positive vesicular breath sounds. No respiratory distress. No wheezes, rales or ronchi noted.  Abdomen: Soft and nontender. Normal bowel sounds. No distention or masses noted. Liver, spleen and kidneys non palpable. Musculoskeletal: Normal range of motion. No signs of joint swelling. No difficulty with gait.  Neurological: Alert and oriented. Cranial nerves II-XII grossly intact. Coordination normal.  Psychiatric: Mood and affect normal. Behavior is normal. Judgment and thought content normal.    BMET    Component Value Date/Time   NA 140 12/29/2020 0849   NA 139 03/14/2020 0949   K 3.9 12/29/2020 0849   CL 106 12/29/2020 0849   CO2 28 12/29/2020 0849   GLUCOSE 90 12/29/2020 0849   BUN 11 12/29/2020 0849   BUN 11 03/14/2020 0949   CREATININE 0.90 12/29/2020 0849   CALCIUM 9.1 12/29/2020 0849   GFRNONAA 76 03/14/2020 0949   GFRAA 88 03/14/2020 0949    Lipid Panel     Component Value Date/Time   CHOL 126 12/29/2020 0849   CHOL 158 03/14/2020 0949   TRIG 53  12/29/2020 0849   HDL 52 12/29/2020 0849   HDL 74 03/14/2020 0949   CHOLHDL 2.4 12/29/2020 0849   VLDL 12.8 01/07/2014 0917   LDLCALC 61 12/29/2020 0849    CBC    Component Value Date/Time   WBC 4.1 12/29/2020 0849   RBC 4.73 12/29/2020 0849   HGB 12.3 12/29/2020 0849   HGB 12.3 03/14/2020 0949   HCT 38.2 12/29/2020 0849   HCT 38.1 03/14/2020 0949   PLT 195 12/29/2020 0849   PLT 221 03/14/2020 0949   MCV 80.8 12/29/2020 0849   MCV 77 (L) 03/14/2020 0949   MCH 26.0 (L) 12/29/2020 0849   MCHC 32.2 12/29/2020 0849   RDW 19.4 (H) 12/29/2020  0849   RDW 19.8 (H) 03/14/2020 0949   LYMPHSABS 1.1 03/14/2020 0949   MONOABS 0.4 01/07/2014 0917   EOSABS 0.1 03/14/2020 0949   BASOSABS 0.0 03/14/2020 0949    Hgb A1C Lab Results  Component Value Date   HGBA1C 5.1 12/29/2020           Assessment & Plan:     RTC in 6 months for your annual exam Webb Silversmith, NP

## 2022-09-27 ENCOUNTER — Encounter: Payer: Self-pay | Admitting: Internal Medicine

## 2022-09-27 ENCOUNTER — Ambulatory Visit: Payer: Managed Care, Other (non HMO) | Admitting: Internal Medicine

## 2022-09-27 VITALS — BP 136/86 | HR 79 | Temp 96.8°F | Ht 64.0 in | Wt 253.0 lb

## 2022-09-27 DIAGNOSIS — E66813 Obesity, class 3: Secondary | ICD-10-CM

## 2022-09-27 DIAGNOSIS — E041 Nontoxic single thyroid nodule: Secondary | ICD-10-CM | POA: Diagnosis not present

## 2022-09-27 DIAGNOSIS — E559 Vitamin D deficiency, unspecified: Secondary | ICD-10-CM

## 2022-09-27 DIAGNOSIS — Z0001 Encounter for general adult medical examination with abnormal findings: Secondary | ICD-10-CM | POA: Diagnosis not present

## 2022-09-27 DIAGNOSIS — Z1231 Encounter for screening mammogram for malignant neoplasm of breast: Secondary | ICD-10-CM

## 2022-09-27 DIAGNOSIS — D500 Iron deficiency anemia secondary to blood loss (chronic): Secondary | ICD-10-CM | POA: Diagnosis not present

## 2022-09-27 DIAGNOSIS — Z6841 Body Mass Index (BMI) 40.0 and over, adult: Secondary | ICD-10-CM

## 2022-09-27 DIAGNOSIS — E538 Deficiency of other specified B group vitamins: Secondary | ICD-10-CM

## 2022-09-27 LAB — COMPLETE METABOLIC PANEL WITH GFR
AG Ratio: 1.5 (calc) (ref 1.0–2.5)
AST: 14 U/L (ref 10–35)
Albumin: 4 g/dL (ref 3.6–5.1)
BUN: 10 mg/dL (ref 7–25)
Chloride: 104 mmol/L (ref 98–110)
Glucose, Bld: 71 mg/dL (ref 65–139)
Total Protein: 6.7 g/dL (ref 6.1–8.1)

## 2022-09-27 LAB — LIPID PANEL
Cholesterol: 147 mg/dL (ref <200)
LDL Cholesterol (Calc): 68 mg/dL (calc)
Non-HDL Cholesterol (Calc): 83 mg/dL (calc) (ref <130)

## 2022-09-27 MED ORDER — AMLODIPINE BESYLATE 5 MG PO TABS: 5.0000 mg | ORAL_TABLET | Freq: Two times a day (BID) | ORAL | 1 refills | Status: DC

## 2022-09-27 MED ORDER — LISINOPRIL 10 MG PO TABS: 15.0000 mg | ORAL_TABLET | Freq: Every day | ORAL | 1 refills | Status: DC

## 2022-09-27 MED ORDER — HYDROCHLOROTHIAZIDE 25 MG PO TABS: 25.0000 mg | ORAL_TABLET | Freq: Every morning | ORAL | 1 refills | Status: DC

## 2022-09-27 NOTE — Patient Instructions (Signed)

## 2022-09-27 NOTE — Assessment & Plan Note (Signed)
She is unable to take phentermine or Qsymia due to HTN Discussed Contrave versus Zepbound, Wegovy or Saxenda-she will check insurance coverage and get back to me Encourage low-carb diet and exercise for weight loss

## 2022-09-27 NOTE — Progress Notes (Signed)
Subjective:    Patient ID: Cassie MatteMarion H Mills, female    DOB: Apr 21, 1976, 47 y.o.   MRN: [id number]  HPI  Patient presents to clinic today for her annual exam.  Flu: Never Tetanus: >10 years ago COVID: Pfizer x 3 Pap smear: <5 years ago per her report, [facility]ammogram: 06/2021 Colon screening: never Vision screening: as needed Dentist: as needed  Diet: She does eat meat. She consumes fruits and veggies. She does eat fried foods. She drinks mostly Dr. Reino KentPepper. Exercise: None  Review of Systems     Past Medical History:  Diagnosis Date   Anemia    Childhood asthma    Heart murmur    History of chicken pox 1984   Hypertension    Menorrhagia    due to fibroids   Uterine fibroid     Current Outpatient Medications  Medication Sig Dispense Refill   amLODipine (NORVASC) 5 MG tablet Take 1 tablet (5 mg total) by mouth 2 (two) times daily. 180 tablet 3   cyclobenzaprine (FLEXERIL) 5 MG tablet Take 1 tablet (5 mg total) by mouth at bedtime as needed for muscle spasms. 20 tablet 0   ferrous sulfate 325 (65 FE) MG tablet Take 325 mg by mouth daily with breakfast.     hydrochlorothiazide (HYDRODIURIL) 25 MG tablet Take 1 tablet (25 mg total) by mouth every morning. 90 tablet 3   lisinopril (ZESTRIL) 10 MG tablet Take 1.5 tablets (15 mg total) by mouth daily. 135 tablet 3   No current facility-administered medications for this visit.    No Known Allergies  Family History  Problem Relation Age of Onset   Arthritis Mother    Hypertension Mother    Kidney disease Mother    Diabetes Mother    Cancer Father        throat (smoker)   Diabetes Maternal Aunt    Kidney disease Maternal Aunt    Diabetes Maternal Uncle    Kidney disease Maternal Uncle    Heart failure Paternal Aunt    CAD Paternal Uncle    Cancer Paternal Grandmother        throat (smoker)   Stroke Neg Hx    Breast cancer Neg Hx     Social History   Socioeconomic History   Marital status: Single     Spouse name: Not on file   Number of children: Not on file   Years of education: Not on file   Highest education level: Not on file  Occupational History   Not on file  Tobacco Use   Smoking status: Never   Smokeless tobacco: Never  Substance and Sexual Activity   Alcohol use: No   Drug use: No   Sexual activity: Yes  Other Topics Concern   Not on file  Social History Narrative   Lives with mother   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  Denies fever, malaise, or abrupt weight changes.  HEENT: Denies eye pain, eye redness, ear pain, ringing in the ears, wax buildup, runny nose,  nasal congestion, bloody nose, or sore throat. Respiratory: Denies difficulty breathing, shortness of breath, cough or sputum production.   Cardiovascular: Denies chest pain, chest tightness, palpitations or swelling in the hands or feet.  Gastrointestinal: Denies abdominal pain, bloating, constipation, diarrhea or blood in the stool.  GU: Denies urgency, frequency, pain with urination, burning sensation, blood in urine, odor or discharge. Musculoskeletal: Patient reports chronic neck pain.  Denies decrease in range of motion, difficulty with gait, or joint swelling.  Skin: Denies redness, rashes, lesions or ulcercations.  Neurological: Denies dizziness, difficulty with memory, difficulty with speech or problems with balance and coordination.  Psych: Patient has a history of depression.  Denies anxiety, SI/HI.  No other specific complaints in a complete review of systems  (except as listed in HPI above).  Objective:   Physical Exam  BP 136/86 (BP Location: Left Arm, Patient Position: Sitting, Cuff Size: Large)   Pulse 79   Temp (!) 96.8 F (36 C) (Temporal)   Ht 5\' 4"  (1.626 m)   Wt 253 lb (114.8 kg)   SpO2 100%   BMI 43.43 kg/m   Wt Readings from Last 3 Encounters:  09/09/21 243 lb (110.2 kg)  12/29/20 255 lb (115.7 kg)  06/03/20 221 lb (100.2 kg)    General: Appears her stated age, obese, in NAD. Skin: Warm, dry and intact. No rashes noted. HEENT: Head: normal shape and size; Eyes: sclera white, no icterus, conjunctiva pink, PERRLA and EOMs intact;  Neck:  Neck supple, trachea midline.  Thyromegaly with nodule noted on the right lobe. Cardiovascular: Normal rate and rhythm. S1,S2 noted.  No murmur, rubs or gallops noted. No JVD or BLE edema.  Pulmonary/Chest: Normal effort and positive vesicular breath sounds. No respiratory distress. No wheezes, rales or ronchi noted.  Abdomen: Normal bowel sounds.  Musculoskeletal: Strength 5/5 BUE/BLE. No difficulty with gait.  Neurological: Alert and oriented. Cranial nerves II-XII grossly intact. Coordination normal.  Psychiatric: Mood and affect normal. Behavior is normal. Judgment and thought content normal.     BMET    Component Value Date/Time   NA 140 12/29/2020 0849   NA 139 03/14/2020 0949   K 3.9 12/29/2020 0849   CL 106 12/29/2020 0849   CO2 28 12/29/2020 0849   GLUCOSE 90 12/29/2020 0849   BUN 11 12/29/2020 0849   BUN 11 03/14/2020 0949   CREATININE 0.90 12/29/2020 0849   CALCIUM 9.1 12/29/2020 0849   GFRNONAA 76 03/14/2020 0949   GFRAA 88 03/14/2020 0949    Lipid Panel     Component Value Date/Time   CHOL 126 12/29/2020 0849   CHOL 158 03/14/2020 0949   TRIG 53 12/29/2020 0849   HDL 52 12/29/2020 0849   HDL 74 03/14/2020 0949   CHOLHDL 2.4 12/29/2020 0849   VLDL 12.8 01/07/2014 0917   LDLCALC 61 12/29/2020 0849    CBC    Component Value Date/Time   WBC 4.1 12/29/2020  0849   RBC 4.73 12/29/2020 0849   HGB 12.3 12/29/2020 0849   HGB 12.3 03/14/2020 0949   HCT 38.2 12/29/2020 0849   HCT 38.1 03/14/2020 0949   PLT 195 12/29/2020 0849   PLT 221 03/14/2020 0949   MCV 80.8 12/29/2020 0849   MCV 77 (L) 03/14/2020 0949   MCH 26.0 (L) 12/29/2020 0849   MCHC 32.2 12/29/2020 0849   RDW 19.4 (H) 12/29/2020 0849   RDW 19.8 (H) 03/14/2020 0949   LYMPHSABS 1.1 03/14/2020 0949   MONOABS 0.4 01/07/2014 8206  EOSABS 0.1 03/14/2020 0949   BASOSABS 0.0 03/14/2020 0949    Hgb A1C Lab Results  Component Value Date   HGBA1C 5.1 12/29/2020           Assessment & Plan:   Preventative Health Maintenance:  Encouraged her to get a flu shot in the fall She declines tetanus today Encouraged her to get her COVID-vaccine booster Will request copy of Pap smear Mammogram ordered-she will call to schedule She declines referral to GI for screening colonoscopy or Cologuard Encouraged her to consume a balanced diet and exercise regimen Advised her to see an eye doctor and dentist annually Will check CBC, c-Met, lipid, A1c today  Thyroid Nodule:  Will check TSH Discussed thyroid ultrasound however she would like to hold off at this time  RTC in 6 months, follow-up chronic conditions Nicki Reaper, NP

## 2022-09-28 LAB — LIPID PANEL
HDL: 64 mg/dL (ref >=50)
Total CHOL/HDL Ratio: 2.3 (calc) (ref <5.0)
Triglycerides: 66 mg/dL (ref <150)

## 2022-09-28 LAB — CBC
HCT: 33.1 % — ABNORMAL LOW (ref 35.0–45.0)
Hemoglobin: 10.1 g/dL — ABNORMAL LOW (ref 11.7–15.5)
MCH: 23.8 pg — ABNORMAL LOW (ref 27.0–33.0)
MCHC: 30.5 g/dL — ABNORMAL LOW (ref 32.0–36.0)
MCV: 78.1 fL — ABNORMAL LOW (ref 80.0–100.0)
MPV: 10.4 fL (ref 7.5–12.5)
Platelets: 221 10*3/uL (ref 140–400)
RBC: 4.24 10*6/uL (ref 3.80–5.10)
RDW: 15.1 % — ABNORMAL HIGH (ref 11.0–15.0)
WBC: 5.1 10*3/uL (ref 3.8–10.8)

## 2022-09-28 LAB — VITAMIN D 25 HYDROXY (VIT D DEFICIENCY, FRACTURES): Vit D, 25-Hydroxy: 12 ng/mL — ABNORMAL LOW (ref 30–100)

## 2022-09-28 LAB — COMPLETE METABOLIC PANEL WITH GFR
ALT: 12 U/L (ref 6–29)
Alkaline phosphatase (APISO): 71 U/L (ref 31–125)
CO2: 29 mmol/L (ref 20–32)
Calcium: 9.2 mg/dL (ref 8.6–10.2)
Creat: 0.84 mg/dL (ref 0.50–0.99)
Globulin: 2.7 g/dL (calc) (ref 1.9–3.7)
Potassium: 4.1 mmol/L (ref 3.5–5.3)
Sodium: 139 mmol/L (ref 135–146)
Total Bilirubin: 0.3 mg/dL (ref 0.2–1.2)
eGFR: 86 mL/min/{1.73_m2} (ref >=60)

## 2022-09-28 LAB — TSH: TSH: 1.49 mIU/L

## 2022-09-28 LAB — HEMOGLOBIN A1C
Hgb A1c MFr Bld: 5.5 % of total Hgb (ref <5.7)
Mean Plasma Glucose: 111 mg/dL
eAG (mmol/L): 6.2 mmol/L

## 2022-09-28 LAB — VITAMIN B12: Vitamin B-12: 449 pg/mL (ref 200–1100)

## 2022-09-28 MED ORDER — VITAMIN D (ERGOCALCIFEROL) 1.25 MG (50000 UNIT) PO CAPS: 50000.0000 [IU] | ORAL_CAPSULE | ORAL | 0 refills | Status: DC

## 2022-09-28 NOTE — Addendum Note (Signed)
Addended by: Lorre Munroe on: 09/28/2022 08:01 AM   Modules accepted: Orders

## 2022-09-29 ENCOUNTER — Encounter: Payer: Self-pay | Admitting: Internal Medicine

## 2022-10-01 ENCOUNTER — Telehealth: Payer: Self-pay | Admitting: *Deleted

## 2022-10-01 NOTE — Telephone Encounter (Signed)
Result note read to pt, verbalizes understanding.   "You have a slight anemia.  Your vitamin D is low.  I need to send in ergocalciferol 50,000 units weekly x 12 weeks.  Liver and kidney function is normal.  Thyroid function is normal.  Cholesterol looks great.  You do not have diabetes.  B12 levels are normal. "

## 2022-12-05 NOTE — Progress Notes (Signed)
Noted patient has had follow up labs 

## 2023-03-21 ENCOUNTER — Other Ambulatory Visit: Payer: Self-pay | Admitting: Internal Medicine

## 2023-03-21 NOTE — Telephone Encounter (Signed)
Cassie Mills [phone] from [facility] is requesting 90 day supply for each   Medication Refill - Medication: hydrochlorothiazide (HYDRODIURIL) 25 MG tablet  amLODipine (NORVASC) 5 MG tablet  lisinopril (ZESTRIL) 10 MG tablet   Has the patient contacted their pharmacy? Yes. (Agent: If no, request that the patient contact the pharmacy for the refill. If patient does not wish to contact the pharmacy document the reason why and proceed with request.) (Agent: If yes, when and what did the pharmacy advise?)  Preferred Pharmacy (with phone number or street name):  [facility] - [place], [place] - [address]  [address] [place] [address]  Phone: [phone] Fax: [phone]   Has the patient been seen for an appointment in the last year OR does the patient have an upcoming appointment? Yes.    Agent: Please be advised that RX refills may take up to 3 business days. We ask that you follow-up with your pharmacy.

## 2023-03-22 MED ORDER — HYDROCHLOROTHIAZIDE 25 MG PO TABS: 25.0000 mg | ORAL_TABLET | Freq: Every morning | ORAL | 0 refills | Status: DC

## 2023-03-22 MED ORDER — AMLODIPINE BESYLATE 5 MG PO TABS: 5.0000 mg | ORAL_TABLET | Freq: Two times a day (BID) | ORAL | 0 refills | Status: DC

## 2023-03-22 MED ORDER — LISINOPRIL 10 MG PO TABS: 15.0000 mg | ORAL_TABLET | Freq: Every day | ORAL | 0 refills | Status: DC

## 2023-03-22 NOTE — Telephone Encounter (Signed)
Requested Prescriptions  Pending Prescriptions Disp Refills   hydrochlorothiazide (HYDRODIURIL) 25 MG tablet 90 tablet 0    Sig: Take 1 tablet (25 mg total) by mouth every morning.     Cardiovascular: Diuretics - Thiazide Passed - 03/21/2023 11:42 AM      Passed - Cr in normal range and within 180 days    Creat  Date Value Ref Range Status  09/27/2022 0.84 0.50 - 0.99 mg/dL Final         Passed - K in normal range and within 180 days    Potassium  Date Value Ref Range Status  09/27/2022 4.1 3.5 - 5.3 mmol/L Final         Passed - Na in normal range and within 180 days    Sodium  Date Value Ref Range Status  09/27/2022 139 135 - 146 mmol/L Final  03/14/2020 139 134 - 144 mmol/L Final         Passed - Last BP in normal range    BP Readings from Last 1 Encounters:  09/27/22 136/86         Passed - Valid encounter within last 6 months    Recent Outpatient Visits           5 months ago Encounter for general adult medical examination with abnormal findings   [place] [facility] [place], Salvadore Oxford, NP   1 year ago Chronic tension-type headache, intractable   [place] [facility] [place], Salvadore Oxford, NP   2 years ago Encounter for general adult medical examination with abnormal findings   [place] [facility] [place], Salvadore Oxford, NP       Future Appointments             In 1 week Lorre Munroe, NP [place] [facility]             amLODipine (NORVASC) 5 MG tablet 180 tablet 0    Sig: Take 1 tablet (5 mg total) by mouth 2 (two) times daily.     Cardiovascular: Calcium Channel Blockers 2 Passed - 03/21/2023 11:42 AM      Passed - Last BP in normal range    BP Readings from Last 1 Encounters:  09/27/22 136/86         Passed - Last Heart Rate in normal range    Pulse Readings from Last 1 Encounters:  09/27/22 79         Passed - Valid encounter within last 6 months    Recent Outpatient  Visits           5 months ago Encounter for general adult medical examination with abnormal findings   [place] [facility] [place], Salvadore Oxford, NP   1 year ago Chronic tension-type headache, intractable   [place] [facility] [place], Salvadore Oxford, NP   2 years ago Encounter for general adult medical examination with abnormal findings   [place] [facility] [place], Salvadore Oxford, NP       Future Appointments             In 1 week Lorre Munroe, NP [place] [facility]             lisinopril (ZESTRIL) 10 MG tablet 135 tablet 0    Sig: Take 1.5 tablets (15 mg total) by mouth daily.     Cardiovascular:  ACE Inhibitors Passed - 03/21/2023 11:42 AM      Passed - Cr in normal range and within 180 days    Creat  Date Value Ref Range Status  09/27/2022 0.84 0.50 - 0.99 mg/dL Final         Passed - K in normal range and within 180 days    Potassium  Date Value Ref Range Status  09/27/2022 4.1 3.5 - 5.3 mmol/L Final         Passed - Patient is not pregnant      Passed - Last BP in normal range    BP Readings from Last 1 Encounters:  09/27/22 136/86         Passed - Valid encounter within last 6 months    Recent Outpatient Visits           5 months ago Encounter for general adult medical examination with abnormal findings   [place] [facility] [place], Salvadore Oxford, NP   1 year ago Chronic tension-type headache, intractable   [place] [facility] [place], Salvadore Oxford, NP   2 years ago Encounter for general adult medical examination with abnormal findings   [place] [facility] [place], Salvadore Oxford, NP       Future Appointments             In 1 week [place], Salvadore Oxford, NP [place] [facility], [facility]

## 2023-04-01 ENCOUNTER — Ambulatory Visit: Payer: Self-pay | Admitting: Internal Medicine

## 2023-04-07 ENCOUNTER — Ambulatory Visit: Payer: Self-pay | Admitting: Internal Medicine

## 2023-06-02 ENCOUNTER — Ambulatory Visit: Payer: Managed Care, Other (non HMO) | Admitting: Internal Medicine

## 2023-07-13 ENCOUNTER — Ambulatory Visit: Payer: Managed Care, Other (non HMO) | Admitting: Internal Medicine

## 2023-07-18 ENCOUNTER — Ambulatory Visit: Payer: Managed Care, Other (non HMO) | Admitting: Internal Medicine

## 2023-08-23 ENCOUNTER — Encounter: Payer: Self-pay | Admitting: Internal Medicine

## 2023-08-23 ENCOUNTER — Ambulatory Visit: Payer: Self-pay | Admitting: Internal Medicine

## 2023-08-23 VITALS — BP 140/82 | HR 81 | Temp 98.5°F | Ht 64.0 in | Wt 261.2 lb

## 2023-08-23 DIAGNOSIS — R739 Hyperglycemia, unspecified: Secondary | ICD-10-CM

## 2023-08-23 DIAGNOSIS — D500 Iron deficiency anemia secondary to blood loss (chronic): Secondary | ICD-10-CM

## 2023-08-23 DIAGNOSIS — G44221 Chronic tension-type headache, intractable: Secondary | ICD-10-CM

## 2023-08-23 DIAGNOSIS — Z136 Encounter for screening for cardiovascular disorders: Secondary | ICD-10-CM

## 2023-08-23 DIAGNOSIS — M542 Cervicalgia: Secondary | ICD-10-CM

## 2023-08-23 DIAGNOSIS — I10 Essential (primary) hypertension: Secondary | ICD-10-CM

## 2023-08-23 DIAGNOSIS — F325 Major depressive disorder, single episode, in full remission: Secondary | ICD-10-CM

## 2023-08-23 DIAGNOSIS — E66813 Obesity, class 3: Secondary | ICD-10-CM

## 2023-08-23 DIAGNOSIS — Z6841 Body Mass Index (BMI) 40.0 and over, adult: Secondary | ICD-10-CM

## 2023-08-23 DIAGNOSIS — J069 Acute upper respiratory infection, unspecified: Secondary | ICD-10-CM

## 2023-08-23 DIAGNOSIS — G8929 Other chronic pain: Secondary | ICD-10-CM

## 2023-08-23 MED ORDER — AMLODIPINE BESYLATE 5 MG PO TABS: 5.0000 mg | ORAL_TABLET | Freq: Two times a day (BID) | ORAL | 1 refills | Status: DC

## 2023-08-23 MED ORDER — HYDROCHLOROTHIAZIDE 25 MG PO TABS: 25.0000 mg | ORAL_TABLET | Freq: Every morning | ORAL | 1 refills | Status: DC

## 2023-08-23 MED ORDER — LISINOPRIL 10 MG PO TABS: 15.0000 mg | ORAL_TABLET | Freq: Every day | ORAL | 1 refills | Status: DC

## 2023-08-23 NOTE — Assessment & Plan Note (Signed)
CBC and iron panel today Continue oral iron 

## 2023-08-23 NOTE — Assessment & Plan Note (Signed)
Currently not an issue Will monitor 

## 2023-08-23 NOTE — Assessment & Plan Note (Signed)
Encourage low-carb diet and exercise for weight loss 

## 2023-08-23 NOTE — Assessment & Plan Note (Signed)
Noncompliant with medication regimen Advised her to be more consistent Lisinopril, HCTZ and Amlodipine refilled today Reinforced DASH diet and exercise for weight loss CMET today

## 2023-08-23 NOTE — Assessment & Plan Note (Addendum)
 Encouraged regular stretching Continue cyclobenzaprine as needed

## 2023-08-23 NOTE — Progress Notes (Signed)
 HPI  Pt presents to the clinic today to establish care and for management of the conditions listed below. She is transferring care from [facility].  Anemia: Her last H/H was 10.1/33.1, 09/2022.  She is taking an iron supplement as prescribed. She does report occasional constipation controlled with diet.  Childhood asthma: This has not been an issue as an adult. She is not using any inhalers at this time.  HTN: Her BP today is 148/92. She has not been taking amlodipine, lisinopril but she has been out of hydrochlorothiazide.  There is no ECG on file..  Frequent headaches: Secondary to chronic neck pain, hormones.  She takes cyclobenzaprine as needed with some relief of symptoms.  Depression: She is not currently taking any medications for this.  She is not currently seeing a therapist.  She denies SI/HI.  She also reports headache, runny nose, nasal congestion, sore throat and cough.  She reports this started 3 days ago.  She is not blowing much mucus out of her nose.  The cough is mostly nonproductive.  She denies ear pain, shortness of breath, nausea, vomiting or diarrhea.  She has had fever, chills and bodyaches but reports this has improved.  She had a negative home COVID test.  She has tried DayQuil and NyQuil OTC with some relief of symptoms.  Past Medical History:  Diagnosis Date   Anemia    Childhood asthma    Heart murmur    History of chicken pox 1984   Hypertension    Menorrhagia    due to fibroids   Uterine fibroid     Current Outpatient Medications  Medication Sig Dispense Refill   amLODipine (NORVASC) 5 MG tablet Take 1 tablet (5 mg total) by mouth 2 (two) times daily. 180 tablet 0   cyclobenzaprine (FLEXERIL) 5 MG tablet Take 1 tablet (5 mg total) by mouth at bedtime as needed for muscle spasms. 20 tablet 0   ferrous sulfate 325 (65 FE) MG tablet Take 325 mg by mouth daily with breakfast.     hydrochlorothiazide (HYDRODIURIL) 25 MG tablet Take 1 tablet (25 mg total) by mouth  every morning. 90 tablet 0   lisinopril (ZESTRIL) 10 MG tablet Take 1.5 tablets (15 mg total) by mouth daily. 135 tablet 0   Vitamin D, Ergocalciferol, (DRISDOL) 1.25 MG (50000 UNIT) CAPS capsule Take 1 capsule (50,000 Units total) by mouth once a week. For 12 weeks. Then start OTC Vitamin D3 2,000 unit daily. 12 capsule 0   No current facility-administered medications for this visit.    No Known Allergies  Family History  Problem Relation Age of Onset   Arthritis Mother    Hypertension Mother    Kidney disease Mother    Diabetes Mother    Throat cancer Father        Smoker   Throat cancer Paternal Grandmother        Smoker   Diabetes Maternal Aunt    Kidney disease Maternal Aunt    Diabetes Maternal Uncle    Kidney disease Maternal Uncle    Heart failure Paternal Aunt    CAD Paternal Uncle    Stroke Neg Hx    Breast cancer Neg Hx    Colon cancer Neg Hx     Social History   Socioeconomic History   Marital status: Single    Spouse name: Not on file   Number of children: Not on file   Years of education: Not on file   Highest education level:  Not on file  Occupational History   Not on file  Tobacco Use   Smoking status: Never   Smokeless tobacco: Never  Substance and Sexual Activity   Alcohol use: No   Drug use: No   Sexual activity: Yes  Other Topics Concern   Not on file  Social History Narrative   Lives with mother   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 Denies malaise, or abrupt weight changes.  HEENT: Patient  reports runny nose, nasal congestion and sore throat.  Denies eye pain, eye redness, ear pain, ringing in the ears, wax buildup, bloody nose. Respiratory: Patient reports cough.  Denies difficulty breathing, shortness of breath, or sputum production.   Cardiovascular: Denies chest pain, chest tightness, palpitations or swelling in the hands or feet.  Gastrointestinal: Denies abdominal pain, bloating, constipation, diarrhea or blood in the stool.  GU: Denies frequency, urgency, pain with urination, blood in urine, odor or discharge. Musculoskeletal: Patient reports chronic neck pain, body aches.  Denies decrease in range of motion, difficulty with gait, or joint swelling.  Skin: Denies redness, rashes, lesions or ulcercations.  Neurological: Denies dizziness, difficulty with memory, difficulty with speech or problems with balance and coordination.  Psych: Patient has a history of depression.  Denies anxiety, SI/HI.  No other specific complaints in a complete review of systems (except as listed in HPI above).  PE:  BP (!) 140/82   Pulse 81   Temp 98.5 F (36.9 C)   Ht 5\' 4"  (1.626 m)   Wt 261 lb 3.2 oz (118.5 kg)   LMP 07/25/2023 (Approximate)   SpO2 100%   BMI 44.83 kg/m    Wt Readings from Last 3 Encounters:  09/27/22 253 lb (114.8 kg)  09/09/21 243 lb (110.2 kg)  12/29/20 255 lb (115.7 kg)    General: Appears her stated age, obese, in NAD. Skin: Warm, dry and intact HEENT: Head: normal shape and size, no sinus tenderness noted; Eyes: sclera white, no icterus, conjunctiva pink, PERRLA and EOMs intact; Nose: Mucosa boggy and moist, turbinates swollen; Throat: Mucosa pink and moist, + PND, no exudate, lesions or ulcerations noted. Neck: Supple, trachea midline.  No adenopathy noted. Cardiovascular: Normal rate and rhythm. S1,S2 noted.  No murmur, rubs or gallops noted.  Pulmonary/Chest: Normal effort and positive vesicular breath sounds. No respiratory distress. No wheezes, rales  or ronchi noted.  Neurological: Alert and oriented.  Psychiatric: Mood and affect normal. Behavior is normal. Judgment and thought content normal.     BMET    Component Value Date/Time   NA 139 09/27/2022 0824   NA 139 03/14/2020 0949   K 4.1 09/27/2022 0824   CL 104 09/27/2022 0824   CO2 29 09/27/2022 0824   GLUCOSE 71 09/27/2022 0824   BUN 10 09/27/2022 0824   BUN 11 03/14/2020 0949   CREATININE 0.84 09/27/2022 0824   CALCIUM 9.2 09/27/2022 0824   GFRNONAA 76 03/14/2020 0949   GFRAA 88 03/14/2020 0949    Lipid Panel  Component Value Date/Time   CHOL 147 09/27/2022 0824   CHOL 158 03/14/2020 0949   TRIG 66 09/27/2022 0824   HDL 64 09/27/2022 0824   HDL 74 03/14/2020 0949   CHOLHDL 2.3 09/27/2022 0824   VLDL 12.8 01/07/2014 0917   LDLCALC 68 09/27/2022 0824    CBC    Component Value Date/Time   WBC 5.1 09/27/2022 0824   RBC 4.24 09/27/2022 0824   HGB 10.1 (L) 09/27/2022 0824   HGB 12.3 03/14/2020 0949   HCT 33.1 (L) 09/27/2022 0824   HCT 38.1 03/14/2020 0949   PLT 221 09/27/2022 0824   PLT 221 03/14/2020 0949   MCV 78.1 (L) 09/27/2022 0824   MCV 77 (L) 03/14/2020 0949   MCH 23.8 (L) 09/27/2022 0824   MCHC 30.5 (L) 09/27/2022 0824   RDW 15.1 (H) 09/27/2022 0824   RDW 19.8 (H) 03/14/2020 0949   LYMPHSABS 1.1 03/14/2020 0949   MONOABS 0.4 01/07/2014 0917   EOSABS 0.1 03/14/2020 0949   BASOSABS 0.0 03/14/2020 0949    Hgb A1C Lab Results  Component Value Date   HGBA1C 5.5 09/27/2022     Assessment and Plan:  Viral URI with Cough:  Encouraged rest and fluids No indication for flu testing as she is outside the window for antiviral therapy No evidence of bacterial infection on exam, antibiotics not indicated Recommend symptomatic care Continue dayquil and nyquil OTC She declines Rx for albuterol inhaler or prescription suppressant at this time  Schedule an appointment for your annual exam Cassie Reaper, NP

## 2023-08-23 NOTE — Patient Instructions (Signed)

## 2023-08-23 NOTE — Assessment & Plan Note (Signed)
 Continue cyclobenzaprine as needed

## 2023-08-26 ENCOUNTER — Ambulatory Visit: Payer: No Typology Code available for payment source | Admitting: Internal Medicine

## 2024-01-19 ENCOUNTER — Encounter: Admitting: Internal Medicine

## 2024-01-26 ENCOUNTER — Encounter: Payer: Self-pay | Admitting: Internal Medicine

## 2024-01-26 ENCOUNTER — Ambulatory Visit (INDEPENDENT_AMBULATORY_CARE_PROVIDER_SITE_OTHER): Admitting: Internal Medicine

## 2024-01-26 VITALS — BP 134/82 | Ht 64.0 in | Wt 258.2 lb

## 2024-01-26 DIAGNOSIS — Z1231 Encounter for screening mammogram for malignant neoplasm of breast: Secondary | ICD-10-CM

## 2024-01-26 DIAGNOSIS — E66813 Obesity, class 3: Secondary | ICD-10-CM

## 2024-01-26 DIAGNOSIS — R739 Hyperglycemia, unspecified: Secondary | ICD-10-CM | POA: Diagnosis not present

## 2024-01-26 DIAGNOSIS — L709 Acne, unspecified: Secondary | ICD-10-CM

## 2024-01-26 DIAGNOSIS — Z136 Encounter for screening for cardiovascular disorders: Secondary | ICD-10-CM

## 2024-01-26 DIAGNOSIS — Z6841 Body Mass Index (BMI) 40.0 and over, adult: Secondary | ICD-10-CM

## 2024-01-26 DIAGNOSIS — L819 Disorder of pigmentation, unspecified: Secondary | ICD-10-CM

## 2024-01-26 DIAGNOSIS — Z0001 Encounter for general adult medical examination with abnormal findings: Secondary | ICD-10-CM

## 2024-01-26 MED ORDER — AMLODIPINE BESYLATE 5 MG PO TABS: 5.0000 mg | ORAL_TABLET | Freq: Two times a day (BID) | ORAL | 1 refills | Status: AC

## 2024-01-26 MED ORDER — LISINOPRIL 10 MG PO TABS: 15.0000 mg | ORAL_TABLET | Freq: Every day | ORAL | 1 refills | Status: AC

## 2024-01-26 MED ORDER — HYDROCHLOROTHIAZIDE 25 MG PO TABS: 25.0000 mg | ORAL_TABLET | Freq: Every morning | ORAL | 1 refills | Status: AC

## 2024-01-26 NOTE — Patient Instructions (Signed)

## 2024-01-26 NOTE — Progress Notes (Signed)
 Subjective:    Patient ID: Cassie Mills, female    DOB: 1975-09-14, 48 y.o.   MRN: [id number]  HPI  Patient presents to clinic today for her annual exam.  Flu: Never Tetanus: >10 years ago COVID: Pfizer x 3 Pap smear: 02/2019, [facility] Mammogram: 06/2021 Colon screening: never Vision screening: as needed Dentist: as needed  Diet: She does eat meat. She consumes fruits and veggies. She does eat fried foods. She drinks mostly Dr. Nunzio. Exercise: None  Review of Systems     Past Medical History:  Diagnosis Date   Anemia    Childhood asthma    Heart murmur    History of chicken pox 1984   Hypertension    Menorrhagia    due to fibroids   Uterine fibroid     Current Outpatient Medications  Medication Sig Dispense Refill   amLODipine  (NORVASC ) 5 MG tablet Take 1 tablet (5 mg total) by mouth 2 (two) times daily. 180 tablet 1   ferrous sulfate 325 (65 FE) MG tablet Take 325 mg by mouth daily with breakfast.     hydrochlorothiazide  (HYDRODIURIL ) 25 MG tablet Take 1 tablet (25 mg total) by mouth every morning. 90 tablet 1   lisinopril  (ZESTRIL ) 10 MG tablet Take 1.5 tablets (15 mg total) by mouth daily. 135 tablet 1   No current facility-administered medications for this visit.    No Known Allergies  Family History  Problem Relation Age of Onset   Arthritis Mother    Hypertension Mother    Kidney disease Mother    Diabetes Mother    Throat cancer Father        Smoker   Throat cancer Paternal Grandmother        Smoker   Diabetes Maternal Aunt    Kidney disease Maternal Aunt    Diabetes Maternal Uncle    Kidney disease Maternal Uncle    Heart failure Paternal Aunt    CAD Paternal Uncle    Stroke Neg Hx    Breast cancer Neg Hx    Colon cancer Neg Hx     Social History   Socioeconomic History   Marital status: Single    Spouse name: Not on file   Number of children: Not on file   Years of education: Not on file   Highest education level: Not  on file  Occupational History   Not on file  Tobacco Use   Smoking status: Never   Smokeless tobacco: Never  Substance and Sexual Activity   Alcohol use: No   Drug use: No   Sexual activity: Yes  Other Topics Concern   Not on file  Social History Narrative   Lives with mother   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  Denies fever, malaise, or abrupt weight changes.  HEENT: Denies eye pain, eye redness, ear pain, ringing in the ears, wax buildup, runny nose, nasal congestion, bloody nose, or sore throat. Respiratory: Denies difficulty breathing, shortness of breath, cough or sputum production.   Cardiovascular: Denies chest  pain, chest tightness, palpitations or swelling in the hands or feet.  Gastrointestinal: Denies abdominal pain, bloating, constipation, diarrhea or blood in the stool.  GU: Denies urgency, frequency, pain with urination, burning sensation, blood in urine, odor or discharge. Musculoskeletal: Patient reports intermittent neck pain.  Denies decrease in range of motion, difficulty with gait, or joint swelling.  Skin: Patient reports acne and hyperpigmentation on face.  Denies redness, rashes, or ulcercations.  Neurological: Denies dizziness, difficulty with memory, difficulty with speech or problems with balance and coordination.  Psych: Patient has a history of depression.  Denies anxiety, SI/HI.  No other specific complaints in a complete review of systems (except as listed in HPI above).  Objective:   Physical Exam  BP 134/82 (BP  Location: Right Arm, Patient Position: Sitting, Cuff Size: Large)   Ht 5' 4 (1.626 m)   Wt 258 lb 3.2 oz (117.1 kg)   LMP 12/20/2023 Comment: spotting  BMI 44.32 kg/m    Wt Readings from Last 3 Encounters:  08/23/23 261 lb 3.2 oz (118.5 kg)  09/27/22 253 lb (114.8 kg)  09/09/21 243 lb (110.2 kg)    General: Appears her stated age, obese, in NAD. Skin: Warm, dry and intact.  Cystic acne noted on face.  Areas of hyperpigmentation noted of cheeks. HEENT: Head: normal shape and size; Eyes: sclera white, no icterus, conjunctiva pink, PERRLA and EOMs intact;  Neck:  Neck supple, trachea midline.  Thyromegaly with nodule noted on the right lobe. Cardiovascular: Normal rate and rhythm. S1,S2 noted.  No murmur, rubs or gallops noted. No JVD or BLE edema.  Pulmonary/Chest: Normal effort and positive vesicular breath sounds. No respiratory distress. No wheezes, rales or ronchi noted.  Abdomen: Normal bowel sounds.  Musculoskeletal: Strength 5/5 BUE/BLE. No difficulty with gait.  Neurological: Alert and oriented. Cranial nerves II-XII grossly intact. Coordination normal.  Psychiatric: Mood and affect normal. Behavior is normal. Judgment and thought content normal.     BMET    Component Value Date/Time   NA 139 09/27/2022 0824   NA 139 03/14/2020 0949   K 4.1 09/27/2022 0824   CL 104 09/27/2022 0824   CO2 29 09/27/2022 0824   GLUCOSE 71 09/27/2022 0824   BUN 10 09/27/2022 0824   BUN 11 03/14/2020 0949   CREATININE 0.84 09/27/2022 0824   CALCIUM 9.2 09/27/2022 0824   GFRNONAA 76 03/14/2020 0949   GFRAA 88 03/14/2020 0949    Lipid Panel     Component Value Date/Time   CHOL 147 09/27/2022 0824   CHOL 158 03/14/2020 0949   TRIG 66 09/27/2022 0824   HDL 64 09/27/2022 0824   HDL 74 03/14/2020 0949   CHOLHDL 2.3 09/27/2022 0824   VLDL 12.8 01/07/2014 0917   LDLCALC 68 09/27/2022 0824    CBC    Component Value Date/Time   WBC 5.1 09/27/2022 0824   RBC 4.24 09/27/2022 0824    HGB 10.1 (L) 09/27/2022 0824   HGB 12.3 03/14/2020 0949   HCT 33.1 (L) 09/27/2022 0824   HCT 38.1 03/14/2020 0949   PLT 221 09/27/2022 0824   PLT 221 03/14/2020 0949   MCV 78.1 (L) 09/27/2022 0824   MCV 77 (L) 03/14/2020 0949   MCH 23.8 (L) 09/27/2022 0824   MCHC 30.5 (L) 09/27/2022 0824   RDW 15.1 (H) 09/27/2022 0824   RDW 19.8 (H) 03/14/2020 0949   LYMPHSABS 1.1 03/14/2020 0949   MONOABS 0.4 01/07/2014 0917   EOSABS 0.1 03/14/2020 0949   BASOSABS  0.0 03/14/2020 0949    Hgb A1C Lab Results  Component Value Date   HGBA1C 5.5 09/27/2022           Assessment & Plan:   Preventative Health Maintenance:  Encouraged her to get a flu shot in the fall She declines tetanus today Encouraged her to get her COVID-vaccine booster Pap smear will be scheduled with GYN Mammogram ordered-she will call to schedule She declines referral to GI for screening colonoscopy or Cologuard Encouraged her to consume a balanced diet and exercise regimen Advised her to see an eye doctor and dentist annually Will check CBC, c-Met, lipid, A1c today   RTC in 6 months, follow-up chronic conditions Angeline Laura, NP

## 2024-01-26 NOTE — Assessment & Plan Note (Signed)
Encourage low-carb diet and exercise for weight loss 

## 2024-01-27 ENCOUNTER — Ambulatory Visit: Payer: Self-pay | Admitting: Internal Medicine

## 2024-01-27 LAB — COMPREHENSIVE METABOLIC PANEL WITH GFR
AG Ratio: 1.3 (calc) (ref 1.0–2.5)
ALT: 12 U/L (ref 6–29)
AST: 16 U/L (ref 10–35)
Albumin: 3.9 g/dL (ref 3.6–5.1)
Alkaline phosphatase (APISO): 67 U/L (ref 31–125)
BUN: 9 mg/dL (ref 7–25)
CO2: 28 mmol/L (ref 20–32)
Calcium: 9.1 mg/dL (ref 8.6–10.2)
Chloride: 102 mmol/L (ref 98–110)
Creat: 0.81 mg/dL (ref 0.50–0.99)
Globulin: 3 g/dL (ref 1.9–3.7)
Glucose, Bld: 79 mg/dL (ref 65–99)
Potassium: 3.7 mmol/L (ref 3.5–5.3)
Sodium: 139 mmol/L (ref 135–146)
Total Bilirubin: 0.4 mg/dL (ref 0.2–1.2)
Total Protein: 6.9 g/dL (ref 6.1–8.1)
eGFR: 89 mL/min/1.73m2 (ref >=60)

## 2024-01-27 LAB — CBC
HCT: 38.6 % (ref 35.0–45.0)
Hemoglobin: 11.8 g/dL (ref 11.7–15.5)
MCH: 24.5 pg — ABNORMAL LOW (ref 27.0–33.0)
MCHC: 30.6 g/dL — ABNORMAL LOW (ref 32.0–36.0)
MCV: 80.1 fL (ref 80.0–100.0)
MPV: 10.5 fL (ref 7.5–12.5)
Platelets: 196 Thousand/uL (ref 140–400)
RBC: 4.82 Million/uL (ref 3.80–5.10)
RDW: 15.9 % — ABNORMAL HIGH (ref 11.0–15.0)
WBC: 4.2 Thousand/uL (ref 3.8–10.8)

## 2024-01-27 LAB — LIPID PANEL
Cholesterol: 140 mg/dL (ref <200)
HDL: 63 mg/dL (ref >=50)
LDL Cholesterol (Calc): 63 mg/dL
Non-HDL Cholesterol (Calc): 77 mg/dL (ref <130)
Total CHOL/HDL Ratio: 2.2 (calc) (ref <5.0)
Triglycerides: 64 mg/dL (ref <150)

## 2024-01-27 LAB — HEMOGLOBIN A1C
Hgb A1c MFr Bld: 5.8 % — ABNORMAL HIGH (ref <5.7)
Mean Plasma Glucose: 120 mg/dL
eAG (mmol/L): 6.6 mmol/L

## 2024-02-24 ENCOUNTER — Ambulatory Visit
Admission: RE | Admit: 2024-02-24 | Discharge: 2024-02-24 | Disposition: A | Discharge status: Home / Self Care | Source: Ambulatory Visit | Attending: Internal Medicine | Admitting: Internal Medicine

## 2024-02-24 DIAGNOSIS — Z1231 Encounter for screening mammogram for malignant neoplasm of breast: Secondary | ICD-10-CM

## 2024-03-01 ENCOUNTER — Ambulatory Visit (INDEPENDENT_AMBULATORY_CARE_PROVIDER_SITE_OTHER): Admitting: Internal Medicine

## 2024-03-01 ENCOUNTER — Encounter: Payer: Self-pay | Admitting: Internal Medicine

## 2024-03-01 VITALS — BP 142/78 | Ht 64.0 in | Wt 263.8 lb

## 2024-03-01 DIAGNOSIS — R2241 Localized swelling, mass and lump, right lower limb: Secondary | ICD-10-CM | POA: Diagnosis not present

## 2024-03-01 DIAGNOSIS — I83813 Varicose veins of bilateral lower extremities with pain: Secondary | ICD-10-CM | POA: Diagnosis not present

## 2024-03-01 NOTE — Addendum Note (Signed)
 Addended by: ZELIA GAUZE D on: 03/01/2024 04:09 PM   Modules accepted: Orders

## 2024-03-01 NOTE — Progress Notes (Signed)
 Subjective:    Patient ID: Cassie Mills, female    DOB: [birth year], 48 y.o.   MRN: [id number]  HPI  Discussed the use of AI scribe software for clinical note transcription with the patient, who gave verbal consent to proceed.  Cassie Mills is a 48 year old female who presents with right leg pain and a palpable lump.  Her right leg pain began this week, accompanied by a palpable lump in the area. The pain is described as soreness upon palpation, particularly exacerbated when squatting at work. There is no throbbing, sharp, or aching pain.  She has not noticed any persistent redness, heat, in the area, although temporary redness occurs after pressing on it. She is concerned about the possibility of a blood clot but has no personal or family history of clotting disorders.  She stands all day at work and has a history of varicose veins, which frequently cause leg pain. Her previous job required her to shift her weight to the right side, potentially contributing to her symptoms.  She is unable to wear supportive shoes at work unless she has a note for a work Counselling psychologist.   Review of Systems     Past Medical History:  Diagnosis Date   Anemia    Childhood asthma    Heart murmur    History of chicken pox 1984   Hypertension    Menorrhagia    due to fibroids   Uterine fibroid     Current Outpatient Medications  Medication Sig Dispense Refill   amLODipine  (NORVASC ) 5 MG tablet Take 1 tablet (5 mg total) by mouth 2 (two) times daily. 180 tablet 1   ferrous sulfate 325 (65 FE) MG tablet Take 325 mg by mouth daily with breakfast.     hydrochlorothiazide  (HYDRODIURIL ) 25 MG tablet Take 1 tablet (25 mg total) by mouth every morning. 90 tablet 1   lisinopril  (ZESTRIL ) 10 MG tablet Take 1.5 tablets (15 mg total) by mouth daily. 135 tablet 1   No current facility-administered medications for this visit.    No Known Allergies  Family History  Problem Relation Age of Onset    Arthritis Mother    Hypertension Mother    Kidney disease Mother    Diabetes Mother    Throat cancer Father        Smoker   Throat cancer Paternal Grandmother        Smoker   Diabetes Maternal Aunt    Kidney disease Maternal Aunt    Diabetes Maternal Uncle    Kidney disease Maternal Uncle    Heart failure Paternal Aunt    CAD Paternal Uncle    Stroke Neg Hx    Breast cancer Neg Hx    Colon cancer Neg Hx     Social History   Socioeconomic History   Marital status: Single    Spouse name: Not on file   Number of children: Not on file   Years of education: Not on file   Highest education level: Not on file  Occupational History   Not on file  Tobacco Use   Smoking status: Never   Smokeless tobacco: Never  Substance and Sexual Activity   Alcohol use: No   Drug use: No   Sexual activity: Yes  Other Topics Concern   Not on file  Social History Narrative   Lives with mother   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  Denies fever, malaise, or abrupt weight changes.  Respiratory: Denies difficulty breathing, shortness of breath, cough or sputum production.   Cardiovascular: Patient reports varicose veins.  Denies chest pain, chest tightness, palpitations or swelling in the hands or feet.  Musculoskeletal: Patient reports intermittent neck pain.  Denies decrease in range of motion, difficulty with gait, or joint swelling.  Skin: Patient reports mass of right lower extremity.  Denies redness, rashes, or ulcercations.  Neurological: Denies dizziness,  difficulty with memory, difficulty with speech or problems with balance and coordination.   No other specific complaints in a complete review of systems (except as listed in HPI above).  Objective:   Physical Exam BP (!) 142/78   Ht 5' 4 (1.626 m)   Wt 263 lb 12.8 oz (119.7 kg)   LMP 02/16/2024 (Approximate)   BMI 45.28 kg/m     Wt Readings from Last 3 Encounters:  01/26/24 258 lb 3.2 oz (117.1 kg)  08/23/23 261 lb 3.2 oz (118.5 kg)  09/27/22 253 lb (114.8 kg)    General: Appears her stated age, obese, in NAD. Skin: Warm, dry and intact.  2.5 cm area of induration noted of the right medial calf just below the knee. Cardiovascular: Normal rate and rhythm. S1,S2 noted.  No murmur, rubs or gallops noted. No JVD or BLE edema.  Pulmonary/Chest: Normal effort and positive vesicular breath sounds. No respiratory distress. No wheezes, rales or ronchi noted.  Musculoskeletal: Strength 5/5 BUE/BLE. No difficulty with gait.  Neurological: Alert and oriented.    BMET    Component Value Date/Time   NA 139 01/26/2024 1530   NA 139 03/14/2020 0949   K 3.7 01/26/2024 1530   CL 102 01/26/2024 1530   CO2 28 01/26/2024 1530   GLUCOSE 79 01/26/2024 1530   BUN 9 01/26/2024 1530   BUN 11 03/14/2020 0949   CREATININE 0.81 01/26/2024 1530   CALCIUM 9.1 01/26/2024 1530   GFRNONAA 76 03/14/2020 0949   GFRAA 88 03/14/2020 0949    Lipid Panel     Component Value Date/Time   CHOL 140 01/26/2024 1530   CHOL 158 03/14/2020 0949   TRIG 64 01/26/2024 1530   HDL 63 01/26/2024 1530   HDL 74 03/14/2020 0949   CHOLHDL 2.2 01/26/2024 1530   VLDL 12.8 01/07/2014 0917   LDLCALC 63 01/26/2024 1530    CBC    Component Value Date/Time   WBC 4.2 01/26/2024 1530   RBC 4.82 01/26/2024 1530   HGB 11.8 01/26/2024 1530   HGB 12.3 03/14/2020 0949   HCT 38.6 01/26/2024 1530   HCT 38.1 03/14/2020 0949   PLT 196 01/26/2024 1530   PLT 221 03/14/2020 0949   MCV 80.1 01/26/2024 1530   MCV 77 (L)  03/14/2020 0949   MCH 24.5 (L) 01/26/2024 1530   MCHC 30.6 (L) 01/26/2024 1530   RDW 15.9 (H) 01/26/2024 1530   RDW 19.8 (H) 03/14/2020 0949   LYMPHSABS 1.1 03/14/2020 0949   MONOABS 0.4 01/07/2014 0917   EOSABS 0.1 03/14/2020 0949   BASOSABS 0.0 03/14/2020 0949    Hgb A1C Lab Results  Component Value Date   HGBA1C 5.8 (H) 01/26/2024           Assessment & Plan:   Assessment and Plan    Mass of right lower extremity, possible superficial venous thrombus Sore lump in right lower extremity without redness, heat, or significant swelling. Differential includes superficial venous thrombus, less concerning than deep vein thrombosis. No history of clots or risk factors. Prolonged standing may contribute to discomfort. Further evaluation needed to rule out superficial venous thrombus. - Order ultrasound of the right lower extremity to evaluate for superficial venous thrombus.   Varicose veins with pain, without inflammation or ulceration -Encouraged weight loss as this can help reduce varicose veins -Consider wearing compression hose for comfort - Provide a doctor's note for wearing appropriate footwear at work.      RTC in 5 months, follow-up chronic conditions Angeline Laura, NP

## 2024-03-01 NOTE — Patient Instructions (Signed)

## 2024-03-02 ENCOUNTER — Ambulatory Visit (INDEPENDENT_AMBULATORY_CARE_PROVIDER_SITE_OTHER): Payer: Self-pay

## 2024-03-02 DIAGNOSIS — R2241 Localized swelling, mass and lump, right lower limb: Secondary | ICD-10-CM

## 2024-03-05 ENCOUNTER — Ambulatory Visit: Payer: Self-pay | Admitting: Internal Medicine

## 2024-07-25 NOTE — Progress Notes (Unsigned)
 "  Subjective:    Patient ID: Cassie Mills, female    DOB: [birth year], 49 y.o.   MRN: [id number]   HPI  Pt presents to the clinic today to establish care and for management of the conditions listed below. She is transferring [facility] from [facility].  Anemia: Her last H/H was 11.8/36.6, 01/2024.  She is taking an iron supplement as prescribed. She does report occasional constipation controlled with diet.  Childhood asthma: This has not been an issue as an adult. She is not using any inhalers at this time. There are no PFT's on file.   HTN: Her BP today is 148/92. She is taking amlodipine , lisinopril  and hydrochlorothiazide  as prescribed.  There is no ECG on file..  Frequent headaches: Secondary to chronic neck pain, hormones.  She takes as needed with some relief of symptoms. She is no longer taking cyclobenzaprine . She does not follow with neurology.  Depression (mild, in remission): She is not currently taking any medications for this.  She is not currently seeing a therapist.  She denies SI/HI.  Prediabetes: Her last A1C was 5.8%, 01/2024. She is not taking any oral diabetic medication at this time. She does not check her sugars.  Past Medical History:  Diagnosis Date   Anemia    Childhood asthma    Heart murmur    History of chicken pox 1984   Hypertension    Menorrhagia    due to fibroids   Uterine fibroid     Current Outpatient Medications  Medication Sig Dispense Refill   amLODipine  (NORVASC ) 5 MG tablet Take 1 tablet (5 mg total) by mouth 2 (two) times daily. 180 tablet 1   ferrous sulfate 325 (65 FE) MG tablet Take 325 mg by mouth daily with breakfast.     hydrochlorothiazide  (HYDRODIURIL ) 25 MG tablet Take 1 tablet (25 mg total) by mouth every morning. 90 tablet 1   lisinopril  (ZESTRIL ) 10 MG tablet Take 1.5 tablets (15 mg total) by mouth daily. 135 tablet 1   No current facility-administered medications for this visit.    No Known Allergies  Family History  Problem  Relation Age of Onset   Arthritis Mother    Hypertension Mother    Kidney disease Mother    Diabetes Mother    Throat cancer Father        Smoker   Throat cancer Paternal Grandmother        Smoker   Diabetes Maternal Aunt    Kidney disease Maternal Aunt    Diabetes Maternal Uncle    Kidney disease Maternal Uncle    Heart failure Paternal Aunt    CAD Paternal Uncle    Stroke Neg Hx    Breast cancer Neg Hx    Colon cancer Neg Hx     Social History   Socioeconomic History   Marital status: Single    Spouse name: Not on file   Number of children: Not on file   Years of education: Not on file   Highest education level: Not on file  Occupational History   Not on file  Tobacco Use   Smoking status: Never   Smokeless tobacco: Never  Substance and Sexual Activity   Alcohol use: No   Drug use: No   Sexual activity: Yes  Other Topics Concern   Not on file  Social History Narrative   Lives with mother   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   Edu: comm college   Activity: no regular  exercise   Diet: some water, vegetables daily   Social Drivers of Health   Tobacco Use: Low Risk (03/01/2024)   Patient History    Smoking Tobacco Use: Never    Smokeless Tobacco Use: Never    Passive Exposure: Not on file  Financial Resource Strain: Not on file  Food Insecurity: Not on file  Transportation Needs: Not on file  Physical Activity: Not on file  Stress: Not on file  Social Connections: Not on file  Intimate Partner Violence: Not on file  Depression (PHQ2-9): Low Risk (03/01/2024)   Depression (PHQ2-9)    PHQ-2 Score: 0  Alcohol Screen: Low Risk (09/27/2022)   Alcohol Screen    Last Alcohol Screening Score (AUDIT): 2  Housing: Not on file  Utilities: Not on file  Health Literacy: Not on file    ROS:  Constitutional: Pt reports intermittent headaches. Denies fever, fatigue, malaise, or abrupt weight changes.  HEENT: Denies eye pain, eye redness, ear pain, ringing  in the ears, wax buildup, runny nose, nasal congestion, bloody nose or sore throat. Respiratory:   Denies difficulty breathing, shortness of breath, cough, or sputum production.   Cardiovascular: Denies chest pain, chest tightness, palpitations or swelling in the hands or feet.  Gastrointestinal: Denies abdominal pain, bloating, constipation, diarrhea or blood in the stool.  GU: Denies frequency, urgency, pain with urination, blood in urine, odor or discharge. Musculoskeletal: Patient reports chronic neck pain.  Denies decrease in range of motion, difficulty with gait, or joint swelling.  Skin: Denies redness, rashes, lesions or ulcercations.  Neurological: Denies dizziness, difficulty with memory, difficulty with speech or problems with balance and coordination.  Psych: Patient has a history of depression.  Denies anxiety, SI/HI.  No other specific complaints in a complete review of systems (except as listed in HPI above).  PE:  There were no vitals taken for this visit.   Wt Readings from Last 3 Encounters:  03/01/24 263 lb 12.8 oz (119.7 kg)  01/26/24 258 lb 3.2 oz (117.1 kg)  08/23/23 261 lb 3.2 oz (118.5 kg)    General: Appears her stated age, obese, in NAD. Skin: Warm, dry and intact HEENT: Head: normal shape and size, no sinus tenderness noted; Eyes: sclera white, no icterus, conjunctiva pink, PERRLA and EOMs intact; Nose: Mucosa boggy and moist, turbinates swollen; Throat: Mucosa pink and moist, + PND, no exudate, lesions or ulcerations noted. Neck: Supple, trachea midline.  No adenopathy noted. Cardiovascular: Normal rate and rhythm. S1,S2 noted.  No murmur, rubs or gallops noted.  Pulmonary/Chest: Normal effort and positive vesicular breath sounds. No respiratory distress. No wheezes, rales or ronchi noted.  Neurological: Alert and oriented.  Psychiatric: Mood and affect normal. Behavior is normal. Judgment and thought content normal.     BMET    Component Value  Date/Time   NA 139 01/26/2024 1530   NA 139 03/14/2020 0949   K 3.7 01/26/2024 1530   CL 102 01/26/2024 1530   CO2 28 01/26/2024 1530   GLUCOSE 79 01/26/2024 1530   BUN 9 01/26/2024 1530   BUN 11 03/14/2020 0949   CREATININE 0.81 01/26/2024 1530   CALCIUM 9.1 01/26/2024 1530   GFRNONAA 76 03/14/2020 0949   GFRAA 88 03/14/2020 0949    Lipid Panel     Component Value Date/Time   CHOL 140 01/26/2024 1530   CHOL 158 03/14/2020 0949   TRIG 64 01/26/2024 1530   HDL 63 01/26/2024 1530   HDL 74 03/14/2020 0949   CHOLHDL  2.2 01/26/2024 1530   VLDL 12.8 01/07/2014 0917   LDLCALC 63 01/26/2024 1530    CBC    Component Value Date/Time   WBC 4.2 01/26/2024 1530   RBC 4.82 01/26/2024 1530   HGB 11.8 01/26/2024 1530   HGB 12.3 03/14/2020 0949   HCT 38.6 01/26/2024 1530   HCT 38.1 03/14/2020 0949   PLT 196 01/26/2024 1530   PLT 221 03/14/2020 0949   MCV 80.1 01/26/2024 1530   MCV 77 (L) 03/14/2020 0949   MCH 24.5 (L) 01/26/2024 1530   MCHC 30.6 (L) 01/26/2024 1530   RDW 15.9 (H) 01/26/2024 1530   RDW 19.8 (H) 03/14/2020 0949   LYMPHSABS 1.1 03/14/2020 0949   MONOABS 0.4 01/07/2014 0917   EOSABS 0.1 03/14/2020 0949   BASOSABS 0.0 03/14/2020 0949    Hgb A1C Lab Results  Component Value Date   HGBA1C 5.8 (H) 01/26/2024     Assessment and Plan:    RTC in 6 months for your annual exam Angeline Laura, NP    "

## 2024-07-26 ENCOUNTER — Ambulatory Visit: Admitting: Internal Medicine

## 2024-08-02 ENCOUNTER — Ambulatory Visit: Admitting: Internal Medicine

## 2024-09-06 ENCOUNTER — Ambulatory Visit: Payer: Self-pay | Admitting: Dermatology
